# Patient Record
Sex: Male | Born: 1950 | Race: White | Hispanic: No | Marital: Married | Smoking: Never smoker
Health system: Southern US, Community
[De-identification: ages and names within clinical notes are randomized; demographics above are authoritative.]

## PROBLEM LIST (undated history)

## (undated) DIAGNOSIS — K589 Irritable bowel syndrome without diarrhea: Secondary | ICD-10-CM

## (undated) DIAGNOSIS — K219 Gastro-esophageal reflux disease without esophagitis: Secondary | ICD-10-CM

## (undated) DIAGNOSIS — M25561 Pain in right knee: Secondary | ICD-10-CM

## (undated) DIAGNOSIS — M25562 Pain in left knee: Secondary | ICD-10-CM

## (undated) DIAGNOSIS — B1920 Unspecified viral hepatitis C without hepatic coma: Secondary | ICD-10-CM

## (undated) DIAGNOSIS — C4490 Unspecified malignant neoplasm of skin, unspecified: Secondary | ICD-10-CM

## (undated) DIAGNOSIS — E079 Disorder of thyroid, unspecified: Secondary | ICD-10-CM

## (undated) DIAGNOSIS — G8929 Other chronic pain: Secondary | ICD-10-CM

## (undated) DIAGNOSIS — K635 Polyp of colon: Secondary | ICD-10-CM

## (undated) HISTORY — DX: Pain in left knee: M25.562

## (undated) HISTORY — PX: BUNIONECTOMY: SHX129

## (undated) HISTORY — DX: Irritable bowel syndrome, unspecified: K58.9

## (undated) HISTORY — DX: Other chronic pain: G89.29

## (undated) HISTORY — DX: Unspecified malignant neoplasm of skin, unspecified: C44.90

## (undated) HISTORY — DX: Disorder of thyroid, unspecified: E07.9

## (undated) HISTORY — DX: Pain in right knee: M25.561

## (undated) HISTORY — PX: LIPOMA EXCISION: SHX5283

## (undated) HISTORY — PX: TONSILLECTOMY: SUR1361

## (undated) HISTORY — DX: Polyp of colon: K63.5

## (undated) HISTORY — DX: Unspecified viral hepatitis C without hepatic coma: B19.20

## (undated) HISTORY — DX: Gastro-esophageal reflux disease without esophagitis: K21.9

---

## 2011-09-25 DIAGNOSIS — B182 Chronic viral hepatitis C: Secondary | ICD-10-CM | POA: Insufficient documentation

## 2015-06-16 HISTORY — PX: ESOPHAGOGASTRODUODENOSCOPY: SHX1529

## 2016-03-06 HISTORY — PX: TRANSURETHRAL RESECTION OF PROSTATE: SHX73

## 2016-05-02 HISTORY — PX: COLONOSCOPY: SHX174

## 2016-05-02 LAB — HM COLONOSCOPY

## 2017-01-10 LAB — LIPID PANEL
CHOLESTEROL: 180 (ref 0–200)
HDL: 37 (ref 35–70)
LDL Cholesterol: 98
Triglycerides: 226 — AB (ref 40–160)

## 2017-01-10 LAB — HEPATIC FUNCTION PANEL
ALT: 35 (ref 10–40)
AST: 18 (ref 14–40)
Bilirubin, Total: 0.5

## 2017-01-10 LAB — CBC AND DIFFERENTIAL
HCT: 48 (ref 41–53)
Hemoglobin: 15.8 (ref 13.5–17.5)
Platelets: 206 (ref 150–399)
WBC: 7.9

## 2017-01-10 LAB — BASIC METABOLIC PANEL
BUN: 16 (ref 4–21)
Creatinine: 1.3 (ref 0.6–1.3)
Glucose: 120
Potassium: 3.8 (ref 3.4–5.3)
Sodium: 142 (ref 137–147)

## 2017-01-10 LAB — TSH: TSH: 2.3 (ref 0.41–5.90)

## 2017-06-13 DIAGNOSIS — R338 Other retention of urine: Secondary | ICD-10-CM | POA: Diagnosis not present

## 2017-06-13 DIAGNOSIS — R972 Elevated prostate specific antigen [PSA]: Secondary | ICD-10-CM | POA: Diagnosis not present

## 2017-06-13 DIAGNOSIS — N401 Enlarged prostate with lower urinary tract symptoms: Secondary | ICD-10-CM | POA: Diagnosis not present

## 2017-07-10 DIAGNOSIS — B182 Chronic viral hepatitis C: Secondary | ICD-10-CM | POA: Diagnosis not present

## 2017-07-10 DIAGNOSIS — R1310 Dysphagia, unspecified: Secondary | ICD-10-CM | POA: Diagnosis not present

## 2017-07-10 DIAGNOSIS — R69 Illness, unspecified: Secondary | ICD-10-CM | POA: Diagnosis not present

## 2017-07-10 DIAGNOSIS — K635 Polyp of colon: Secondary | ICD-10-CM | POA: Diagnosis not present

## 2017-07-10 DIAGNOSIS — R972 Elevated prostate specific antigen [PSA]: Secondary | ICD-10-CM | POA: Diagnosis not present

## 2017-07-10 DIAGNOSIS — E039 Hypothyroidism, unspecified: Secondary | ICD-10-CM | POA: Diagnosis not present

## 2017-07-10 DIAGNOSIS — C4491 Basal cell carcinoma of skin, unspecified: Secondary | ICD-10-CM | POA: Diagnosis not present

## 2017-07-10 DIAGNOSIS — E785 Hyperlipidemia, unspecified: Secondary | ICD-10-CM | POA: Diagnosis not present

## 2017-07-10 DIAGNOSIS — R319 Hematuria, unspecified: Secondary | ICD-10-CM | POA: Diagnosis not present

## 2017-07-10 DIAGNOSIS — K589 Irritable bowel syndrome without diarrhea: Secondary | ICD-10-CM | POA: Diagnosis not present

## 2017-07-10 DIAGNOSIS — N400 Enlarged prostate without lower urinary tract symptoms: Secondary | ICD-10-CM | POA: Diagnosis not present

## 2017-07-10 LAB — BASIC METABOLIC PANEL
BUN: 18 (ref 4–21)
Creatinine: 1.3 (ref 0.6–1.3)
GLUCOSE: 91
Sodium: 142 (ref 137–147)

## 2017-07-10 LAB — TSH: TSH: 2 (ref 0.41–5.90)

## 2017-07-10 LAB — PSA: PSA: 1.01

## 2017-07-10 LAB — CBC AND DIFFERENTIAL
HCT: 45 (ref 41–53)
Hemoglobin: 14.7 (ref 13.5–17.5)
Platelets: 178 (ref 150–399)
WBC: 4.4

## 2017-07-10 LAB — LIPID PANEL
CHOLESTEROL: 156 (ref 0–200)
HDL: 36 (ref 35–70)
LDL Cholesterol: 82
Triglycerides: 188 — AB (ref 40–160)

## 2017-07-10 LAB — HEPATIC FUNCTION PANEL
ALT: 30 (ref 10–40)
AST: 20 (ref 14–40)

## 2017-07-10 LAB — HEMOGLOBIN A1C: Hemoglobin A1C: 14.7

## 2017-07-12 DIAGNOSIS — L570 Actinic keratosis: Secondary | ICD-10-CM | POA: Diagnosis not present

## 2017-07-12 DIAGNOSIS — L578 Other skin changes due to chronic exposure to nonionizing radiation: Secondary | ICD-10-CM | POA: Diagnosis not present

## 2017-07-12 DIAGNOSIS — L814 Other melanin hyperpigmentation: Secondary | ICD-10-CM | POA: Diagnosis not present

## 2017-07-12 DIAGNOSIS — D1801 Hemangioma of skin and subcutaneous tissue: Secondary | ICD-10-CM | POA: Diagnosis not present

## 2017-07-12 DIAGNOSIS — L218 Other seborrheic dermatitis: Secondary | ICD-10-CM | POA: Diagnosis not present

## 2017-11-20 DIAGNOSIS — Z0100 Encounter for examination of eyes and vision without abnormal findings: Secondary | ICD-10-CM | POA: Diagnosis not present

## 2018-03-18 ENCOUNTER — Encounter: Payer: Self-pay | Admitting: Family Medicine

## 2018-03-18 ENCOUNTER — Ambulatory Visit (INDEPENDENT_AMBULATORY_CARE_PROVIDER_SITE_OTHER): Payer: Medicare HMO | Admitting: Family Medicine

## 2018-03-18 ENCOUNTER — Ambulatory Visit (INDEPENDENT_AMBULATORY_CARE_PROVIDER_SITE_OTHER): Payer: Medicare HMO

## 2018-03-18 VITALS — BP 132/64 | HR 78 | Temp 98.2°F | Ht 74.0 in | Wt 198.0 lb

## 2018-03-18 DIAGNOSIS — M222X1 Patellofemoral disorders, right knee: Secondary | ICD-10-CM | POA: Insufficient documentation

## 2018-03-18 DIAGNOSIS — E039 Hypothyroidism, unspecified: Secondary | ICD-10-CM

## 2018-03-18 DIAGNOSIS — G8929 Other chronic pain: Secondary | ICD-10-CM | POA: Diagnosis not present

## 2018-03-18 DIAGNOSIS — M222X2 Patellofemoral disorders, left knee: Secondary | ICD-10-CM

## 2018-03-18 DIAGNOSIS — M25562 Pain in left knee: Secondary | ICD-10-CM | POA: Diagnosis not present

## 2018-03-18 DIAGNOSIS — M1711 Unilateral primary osteoarthritis, right knee: Secondary | ICD-10-CM | POA: Diagnosis not present

## 2018-03-18 DIAGNOSIS — M1712 Unilateral primary osteoarthritis, left knee: Secondary | ICD-10-CM | POA: Diagnosis not present

## 2018-03-18 DIAGNOSIS — M25561 Pain in right knee: Secondary | ICD-10-CM

## 2018-03-18 HISTORY — DX: Other chronic pain: G89.29

## 2018-03-18 HISTORY — DX: Pain in left knee: M25.562

## 2018-03-18 LAB — BASIC METABOLIC PANEL
BUN: 13 mg/dL (ref 6–23)
CALCIUM: 9.7 mg/dL (ref 8.4–10.5)
CO2: 28 mEq/L (ref 19–32)
Chloride: 103 mEq/L (ref 96–112)
Creatinine, Ser: 1.32 mg/dL (ref 0.40–1.50)
GFR: 57.07 mL/min — AB (ref >60.00)
Glucose, Bld: 83 mg/dL (ref 70–99)
Potassium: 4.3 mEq/L (ref 3.5–5.1)
Sodium: 139 mEq/L (ref 135–145)

## 2018-03-18 LAB — TSH: TSH: 3.59 u[IU]/mL (ref 0.35–4.50)

## 2018-03-18 MED ORDER — LEVOTHYROXINE SODIUM 75 MCG PO TABS: 75.0000 ug | ORAL_TABLET | Freq: Every day | ORAL | 2 refills | Status: DC

## 2018-03-18 NOTE — Assessment & Plan Note (Signed)
Update TSH

## 2018-03-18 NOTE — Progress Notes (Signed)
Nathan [name] - 68 y.o. male MRN [id number]  Date of birth: 1950-07-05  Subjective Chief Complaint  Patient presents with  . Knee Pain    HPI Nathan [name] is a 68 y.o. male with history of hypothyroidism, Hep C s/p tx with Harvoni, and GERD here today to establish care with complaint of bilateral knee pain.   -Knee pain:  Reports having knee pain for years, worsening over the past few months.  Pain worse with walking/standing.  Went hiking with his son and knee pain much worse, especially walking downhill.  Denies swelling of the knee.  Takes aleve occasionally with some improvement.    -Hypothyroid:  Follows [name] 6 months.  No recent changes to medication strength.  Denies symptoms of hypo/hyper-thyroidism.   ROS:  A comprehensive ROS was completed and negative except as noted per HPI  Allergies  Allergen Reactions  . Bactrim [Sulfamethoxazole-Trimethoprim] Other (See Comments)    Fever     Past Medical History:  Diagnosis Date  . Colon polyps   . GERD (gastroesophageal reflux disease)   . Hepatitis C    s/p tx with Harvoni  . Skin cancer   . Thyroid disease     Past Surgical History:  Procedure Laterality Date  . BUNIONECTOMY Right   . LIPOMA EXCISION    . TONSILLECTOMY    . TRANSURETHRAL RESECTION OF PROSTATE  2018    Social History   Socioeconomic History  . Marital status: Married    Spouse name: Not on file  . Number of children: Not on file  . Years of education: Not on file  . Highest education level: Not on file  Occupational History  . Not on file  Social Needs  . Financial resource strain: Not on file  . Food insecurity:    Worry: Not on file    Inability: Not on file  . Transportation needs:    Medical: Not on file    Non-medical: Not on file  Tobacco Use  . Smoking status: Never Smoker  . Smokeless tobacco: Never Used  Substance and Sexual Activity  . Alcohol use: Not Currently    Frequency: Never  . Drug use: Never  . Sexual activity: Yes    Lifestyle  . Physical activity:    Days per week: Not on file    Minutes per session: Not on file  . Stress: Not on file  Relationships  . Social connections:    Talks on phone: Not on file    Gets together: Not on file    Attends religious service: Not on file    Active member of club or organization: Not on file    Attends meetings of clubs or organizations: Not on file    Relationship status: Not on file  Other Topics Concern  . Not on file  Social History Narrative  . Not on file    Family History  Problem Relation Age of Onset  . Heart disease Mother   . Heart disease Father   . Hearing loss Father   . Hypertension Father   . Heart disease Sister   . Hypertension Sister   . Heart disease Brother   . Hypertension Brother   . Heart disease Maternal Grandfather   . Heart disease Paternal Grandfather     Health Maintenance  Topic Date Due  . Hepatitis C Screening  January 07, 1949  . TETANUS/TDAP  09/12/1967  . COLONOSCOPY  09/12/1998  . PNA vac Low Risk Adult (1 of 2 -  PCV13) 09/11/2013  . INFLUENZA VACCINE  10/04/2017    ----------------------------------------------------------------------------------------------------------------------------------------------------------------------------------------------------------------- Physical Exam BP 132/64   Pulse 78   Temp 98.2 F (36.8 C) (Oral)   Ht 6\' 2"  (1.88 m)   Wt 198 lb (89.8 kg)   SpO2 98%   BMI 25.42 kg/m   Physical Exam Constitutional:      Appearance: Normal appearance.  HENT:     Head: Normocephalic and atraumatic.     Mouth/Throat:     Pharynx: Oropharynx is clear.  Eyes:     General: No scleral icterus. Cardiovascular:     Rate and Rhythm: Normal rate and regular rhythm.  Pulmonary:     Effort: Pulmonary effort is normal.     Breath sounds: Normal breath sounds.  Abdominal:     General: Abdomen is flat. Bowel sounds are normal. There is no distension.     Palpations: Abdomen is soft.  There is no mass.  Musculoskeletal:     Comments: Bilateral knees normal to inspection and palpation.  No effusion noted.  ROM is normal with mild crepitus with flexion.  Negative meniscal provocation testing.   Lymphadenopathy:     Cervical: No cervical adenopathy.  Skin:    General: Skin is warm and dry.     Findings: No rash.  Neurological:     General: No focal deficit present.     Mental Status: He is alert.  Psychiatric:        Mood and Affect: Mood normal.        Behavior: Behavior normal.     ------------------------------------------------------------------------------------------------------------------------------------------------------------------------------------------------------------------- Assessment and Plan  Chronic pain of both knees -Xrays ordered -May benefit from trial of anti-inflammatory   Acquired hypothyroidism Update TSH

## 2018-03-18 NOTE — Patient Instructions (Signed)
-  Great to meet you today! -Continue current medications -We'll be in touch with lab and xray results.  -See me again in 6 months or sooner if needed.

## 2018-03-18 NOTE — Assessment & Plan Note (Signed)
-  Xrays ordered -May benefit from trial of anti-inflammatory

## 2018-03-20 NOTE — Progress Notes (Signed)
Xrays show bilateral arthritic changes.  We can try oral anti-inflammatories as needed initially or he can see Dr. Raeford Razor and try injections.

## 2018-03-20 NOTE — Progress Notes (Signed)
Labs are normal as well.

## 2018-03-26 ENCOUNTER — Encounter: Payer: Self-pay | Admitting: Family Medicine

## 2018-03-26 ENCOUNTER — Ambulatory Visit (INDEPENDENT_AMBULATORY_CARE_PROVIDER_SITE_OTHER): Payer: Medicare HMO | Admitting: Family Medicine

## 2018-03-26 VITALS — BP 102/78 | HR 98 | Temp 98.2°F | Ht 74.0 in | Wt 200.4 lb

## 2018-03-26 DIAGNOSIS — M222X1 Patellofemoral disorders, right knee: Secondary | ICD-10-CM

## 2018-03-26 DIAGNOSIS — M222X2 Patellofemoral disorders, left knee: Secondary | ICD-10-CM

## 2018-03-26 NOTE — Assessment & Plan Note (Signed)
Has some weakness with hip abduction which is likely contributing.  X-rays did not show any significant structural changes and no effusion on exam today. -Focus and counseled on home exercise therapy and supportive care. -Provided samples of Pennsaid. -If no improvement will consider physical therapy.

## 2018-03-26 NOTE — Patient Instructions (Signed)
Nice to meet you  Please try the rehab exercises  Please use ice if need be. 20 minutes at a time and 3-4 times a day  Please try Aspercreme with lidocaine. Please follow-up with me in 4 to 6 weeks if no better.

## 2018-03-26 NOTE — Progress Notes (Signed)
Nathan [name] - 68 y.o. male MRN [id number]  Date of birth: 05-Apr-1950  SUBJECTIVE:  Including CC & ROS.  Chief Complaint  Patient presents with  . Knee Pain    both / pt sts for years     Nathan [name] is a 68 y.o. male that is presenting with acute on chronic left and right knee pain.  The pain is been ongoing for years.  The pain is anterior nature.  He feels the pain worse with walking or getting up from a seated position.  No prior history of surgeries.  Has tried ibuprofen with limited improvement.  Pain is sharp and stabbing.  Denies any swelling.  No injury or inciting event.  Pain is localized to the knee..  Independent review of the left and right knee x-rays from 1/13 show moderate medial joint space narrowing of the left knee and mild medial right knee joint space narrowing   Review of Systems  Constitutional: Negative for fever.  HENT: Negative for congestion.   Respiratory: Negative for cough.   Cardiovascular: Negative for chest pain.  Gastrointestinal: Negative for abdominal pain.  Musculoskeletal: Negative for joint swelling.  Skin: Negative for color change.  Neurological: Negative for weakness.  Hematological: Negative for adenopathy.  Psychiatric/Behavioral: Negative for agitation.    HISTORY: Past Medical, Surgical, Social, and Family History Reviewed & Updated per EMR.   Pertinent Historical Findings include:  Past Medical History:  Diagnosis Date  . Chronic pain of both knees 03/18/2018  . Colon polyps   . GERD (gastroesophageal reflux disease)   . Hepatitis C    s/p tx with Harvoni  . Skin cancer   . Thyroid disease     Past Surgical History:  Procedure Laterality Date  . BUNIONECTOMY Right   . LIPOMA EXCISION    . TONSILLECTOMY    . TRANSURETHRAL RESECTION OF PROSTATE  2018    Allergies  Allergen Reactions  . Bactrim [Sulfamethoxazole-Trimethoprim] Other (See Comments)    Fever     Family History  Problem Relation Age of Onset  . Heart  disease Mother   . Heart disease Father   . Hearing loss Father   . Hypertension Father   . Heart disease Sister   . Hypertension Sister   . Heart disease Brother   . Hypertension Brother   . Heart disease Maternal Grandfather   . Heart disease Paternal Grandfather      Social History   Socioeconomic History  . Marital status: Married    Spouse name: Not on file  . Number of children: Not on file  . Years of education: Not on file  . Highest education level: Not on file  Occupational History  . Not on file  Social Needs  . Financial resource strain: Not on file  . Food insecurity:    Worry: Not on file    Inability: Not on file  . Transportation needs:    Medical: Not on file    Non-medical: Not on file  Tobacco Use  . Smoking status: Never Smoker  . Smokeless tobacco: Never Used  Substance and Sexual Activity  . Alcohol use: Not Currently    Frequency: Never  . Drug use: Never  . Sexual activity: Yes  Lifestyle  . Physical activity:    Days per week: Not on file    Minutes per session: Not on file  . Stress: Not on file  Relationships  . Social connections:    Talks on phone: Not on  file    Gets together: Not on file    Attends religious service: Not on file    Active member of club or organization: Not on file    Attends meetings of clubs or organizations: Not on file    Relationship status: Not on file  . Intimate partner violence:    Fear of current or ex partner: Not on file    Emotionally abused: Not on file    Physically abused: Not on file    Forced sexual activity: Not on file  Other Topics Concern  . Not on file  Social History Narrative  . Not on file     PHYSICAL EXAM:  VS: BP 102/78   Pulse 98   Temp 98.2 F (36.8 C) (Oral)   Ht 6\' 2"  (1.88 m)   Wt 200 lb 6.4 oz (90.9 kg)   SpO2 96%   BMI 25.73 kg/m  Physical Exam Gen: NAD, alert, cooperative with exam, well-appearing ENT: normal lips, normal nasal mucosa,  Eye: normal EOM,  normal conjunctiva and lids CV:  no edema, +2 pedal pulses   Resp: no accessory muscle use, non-labored,  Skin: no rashes, no areas of induration  Neuro: normal tone, normal sensation to touch Psych:  normal insight, alert and oriented MSK:  Left and right knee: No effusion. No tenderness to palpation over the medial lateral joint line. Normal range of motion. Normal strength to resistance No instability with valgus or varus stress testing. Negative McMurray's test. No pain with patellar grind or compression. Neurovascular intact.     ASSESSMENT & PLAN:   Patellofemoral syndrome of both knees Has some weakness with hip abduction which is likely contributing.  X-rays did not show any significant structural changes and no effusion on exam today. -Focus and counseled on home exercise therapy and supportive care. -Provided samples of Pennsaid. -If no improvement will consider physical therapy.

## 2018-03-28 ENCOUNTER — Encounter: Payer: Self-pay | Admitting: Family Medicine

## 2018-04-10 DIAGNOSIS — L280 Lichen simplex chronicus: Secondary | ICD-10-CM | POA: Diagnosis not present

## 2018-04-10 DIAGNOSIS — L570 Actinic keratosis: Secondary | ICD-10-CM | POA: Diagnosis not present

## 2018-04-10 DIAGNOSIS — D485 Neoplasm of uncertain behavior of skin: Secondary | ICD-10-CM | POA: Diagnosis not present

## 2018-04-10 DIAGNOSIS — L821 Other seborrheic keratosis: Secondary | ICD-10-CM | POA: Diagnosis not present

## 2018-04-10 DIAGNOSIS — D229 Melanocytic nevi, unspecified: Secondary | ICD-10-CM | POA: Diagnosis not present

## 2018-04-23 DIAGNOSIS — Z0100 Encounter for examination of eyes and vision without abnormal findings: Secondary | ICD-10-CM | POA: Diagnosis not present

## 2018-04-23 DIAGNOSIS — H524 Presbyopia: Secondary | ICD-10-CM | POA: Diagnosis not present

## 2018-09-13 ENCOUNTER — Ambulatory Visit (INDEPENDENT_AMBULATORY_CARE_PROVIDER_SITE_OTHER): Payer: Medicare HMO | Admitting: Family Medicine

## 2018-09-13 ENCOUNTER — Telehealth: Payer: Self-pay | Admitting: General Practice

## 2018-09-13 ENCOUNTER — Encounter: Payer: Self-pay | Admitting: Family Medicine

## 2018-09-13 ENCOUNTER — Telehealth: Payer: Self-pay | Admitting: Family Medicine

## 2018-09-13 DIAGNOSIS — Z20828 Contact with and (suspected) exposure to other viral communicable diseases: Secondary | ICD-10-CM

## 2018-09-13 DIAGNOSIS — Z20822 Contact with and (suspected) exposure to covid-19: Secondary | ICD-10-CM

## 2018-09-13 NOTE — Telephone Encounter (Signed)
Called [facility] Pharmacist @ [facility], he explained that it was the same , however the generic Levothyroxine is no longer available .

## 2018-09-13 NOTE — Addendum Note (Signed)
Addended by: Denman George on: 09/13/2018 09:43 AM   Modules accepted: Orders

## 2018-09-13 NOTE — Telephone Encounter (Signed)
Pt has been scheduled for Covid testing.  Pt was referred by: Luetta Nutting, DO Scheduled apt with pt directly.

## 2018-09-13 NOTE — Assessment & Plan Note (Signed)
-  Recommend testing for COVID given unprotected close contact with known positive individual.   -Discussed isolating until results return.  -He will let us know if he develops symptoms.

## 2018-09-13 NOTE — Telephone Encounter (Signed)
Sheran Spine, Pharmacist with [facility], requesting call back from CMA to discuss switching patient from levothyroxine (SYNTHROID, LEVOTHROID) 75 MCG tablet to uthyrox. Sheran Spine did not specify why change was requested. Please advise.

## 2018-09-13 NOTE — Progress Notes (Signed)
Nathan [name] - 68 y.o. male MRN [id number]  Date of birth: [birth year]   This visit type was conducted due to national recommendations for restrictions regarding the COVID-19 Pandemic (e.g. social distancing).  This format is felt to be most appropriate for this patient at this time.  All issues noted in this document were discussed and addressed.  No physical exam was performed (except for noted visual exam findings with Video Visits).  I discussed the limitations of evaluation and management by telemedicine and the availability of in person appointments. The patient expressed understanding and agreed to proceed.  I connected with@ on 09/13/18 at  8:30 AM EDT by a video enabled telemedicine application and verified that I am speaking with the correct person using two identifiers.   Patient Location: Home [address] [place] [address]   Provider location:   Home office  Chief Complaint  Patient presents with  . Possible expsoure COVID    on job site, 4 other workers exposed by 5 ppl within a 7-10 days , No Sx/Sy, self quarantine.     HPI  Nathan [name] is a 68 y.o. male who presents via audio/video conferencing for a telehealth visit today.  He reports exposure to COVID-19.  He states that he was visiting a job site and had contact with multiple employees that tested positive for [place].  Also spent time riding in a truck with an employee that tested positive for [place].  He was not wearing a mask at the time.  This all occurred over the pat 7-10 days.   He denies symptoms at this time including fever, chills, rash, nausea, vomiting, diarrhea, chest pain, shortness of breath, headache, body aches or fatigue.     ROS:  A comprehensive ROS was completed and negative except as noted per HPI  Past Medical History:  Diagnosis Date  . Chronic pain of both knees 03/18/2018  . Colon polyps   . GERD (gastroesophageal reflux disease)   . Hepatitis C    s/p tx with Harvoni  . Skin cancer   .  Thyroid disease     Past Surgical History:  Procedure Laterality Date  . BUNIONECTOMY Right   . COLONOSCOPY  05/02/2016   [facility]  . ESOPHAGOGASTRODUODENOSCOPY  06/16/2015   [facility]  . LIPOMA EXCISION    . TONSILLECTOMY    . TRANSURETHRAL RESECTION OF PROSTATE  2018    Family History  Problem Relation Age of Onset  . Heart disease Mother   . Heart disease Father   . Hearing loss Father   . Hypertension Father   . Heart disease Sister   . Hypertension Sister   . Heart disease Brother   . Hypertension Brother   . Heart disease Maternal Grandfather   . Heart disease Paternal Grandfather     Social History   Socioeconomic History  . Marital status: Married    Spouse name: Not on file  . Number of children: Not on file  . Years of education: Not on file  . Highest education level: Not on file  Occupational History  . Not on file  Social Needs  . Financial resource strain: Not on file  . Food insecurity    Worry: Not on file    Inability: Not on file  . Transportation needs    Medical: Not on file    Non-medical: Not on file  Tobacco Use  . Smoking status: Never Smoker  . Smokeless tobacco:  Never Used  Substance and Sexual Activity  . Alcohol use: Not Currently    Frequency: Never  . Drug use: Never  . Sexual activity: Yes  Lifestyle  . Physical activity    Days per week: Not on file    Minutes per session: Not on file  . Stress: Not on file  Relationships  . Social Herbalist on phone: Not on file    Gets together: Not on file    Attends religious service: Not on file    Active member of club or organization: Not on file    Attends meetings of clubs or organizations: Not on file    Relationship status: Not on file  . Intimate partner violence    Fear of current or ex partner: Not on file    Emotionally abused: Not on file    Physically abused: Not on file    Forced sexual activity: Not on file   Other Topics Concern  . Not on file  Social History Narrative  . Not on file     Current Outpatient Medications:  .  levothyroxine (SYNTHROID, LEVOTHROID) 75 MCG tablet, Take 1 tablet (75 mcg total) by mouth daily before breakfast., Disp: 90 tablet, Rfl: 2 .  Omega-3 Fatty Acids (FISH OIL) 1000 MG CAPS, Take by mouth daily., Disp: , Rfl:   EXAM:  VITALS per patient if applicable: BP 213/08 Comment: bp cuff @ HM  Pulse 86   Ht 6\' 2"  (1.88 m)   Wt 198 lb (89.8 kg)   BMI 25.42 kg/m   GENERAL: alert, oriented, appears well and in no acute distress  HEENT: atraumatic, conjunttiva clear, no obvious abnormalities on inspection of external nose and ears  NECK: normal movements of the head and neck  LUNGS: on inspection no signs of respiratory distress, breathing rate appears normal, no obvious gross SOB, gasping or wheezing  CV: no obvious cyanosis  MS: moves all visible extremities without noticeable abnormality  PSYCH/NEURO: pleasant and cooperative, no obvious depression or anxiety, speech and thought processing grossly intact  ASSESSMENT AND PLAN:  Discussed the following assessment and plan:  Exposure to Covid-19 Virus -Recommend testing for COVID given unprotected close contact with known positive individual.   -Discussed isolating until results return.  -He will let us know if he develops symptoms.        I discussed the assessment and treatment plan with the patient. The patient was provided an opportunity to ask questions and all were answered. The patient agreed with the plan and demonstrated an understanding of the instructions.   The patient was advised to call back or seek an in-person evaluation if the symptoms worsen or if the condition fails to improve as anticipated.    Luetta Nutting, DO

## 2018-09-16 ENCOUNTER — Ambulatory Visit: Payer: Medicare HMO | Admitting: Family Medicine

## 2018-09-16 ENCOUNTER — Other Ambulatory Visit: Payer: Medicare HMO

## 2018-09-16 DIAGNOSIS — Z20822 Contact with and (suspected) exposure to covid-19: Secondary | ICD-10-CM

## 2018-09-16 DIAGNOSIS — R6889 Other general symptoms and signs: Secondary | ICD-10-CM | POA: Diagnosis not present

## 2018-09-20 LAB — NOVEL CORONAVIRUS, NAA: SARS-CoV-2, NAA: NOT DETECTED

## 2018-09-26 ENCOUNTER — Other Ambulatory Visit (INDEPENDENT_AMBULATORY_CARE_PROVIDER_SITE_OTHER): Payer: Medicare HMO

## 2018-09-26 ENCOUNTER — Other Ambulatory Visit: Payer: Self-pay

## 2018-09-26 DIAGNOSIS — N400 Enlarged prostate without lower urinary tract symptoms: Secondary | ICD-10-CM

## 2018-09-26 DIAGNOSIS — R748 Abnormal levels of other serum enzymes: Secondary | ICD-10-CM

## 2018-09-26 DIAGNOSIS — R739 Hyperglycemia, unspecified: Secondary | ICD-10-CM | POA: Diagnosis not present

## 2018-09-26 DIAGNOSIS — E039 Hypothyroidism, unspecified: Secondary | ICD-10-CM | POA: Diagnosis not present

## 2018-09-26 DIAGNOSIS — E782 Mixed hyperlipidemia: Secondary | ICD-10-CM

## 2018-09-26 LAB — BASIC METABOLIC PANEL
BUN: 14 mg/dL (ref 6–23)
CO2: 29 mEq/L (ref 19–32)
Calcium: 9.9 mg/dL (ref 8.4–10.5)
Chloride: 105 mEq/L (ref 96–112)
Creatinine, Ser: 1.24 mg/dL (ref 0.40–1.50)
GFR: 57.63 mL/min — ABNORMAL LOW (ref >60.00)
Glucose, Bld: 79 mg/dL (ref 70–99)
Potassium: 4.3 mEq/L (ref 3.5–5.1)
Sodium: 141 mEq/L (ref 135–145)

## 2018-09-26 LAB — LIPID PANEL
Cholesterol: 142 mg/dL (ref 0–200)
HDL: 34.7 mg/dL — ABNORMAL LOW (ref >39.00)
NonHDL: 107.17
Total CHOL/HDL Ratio: 4
Triglycerides: 211 mg/dL — ABNORMAL HIGH (ref 0.0–149.0)
VLDL: 42.2 mg/dL — ABNORMAL HIGH (ref 0.0–40.0)

## 2018-09-26 LAB — CBC WITH DIFFERENTIAL/PLATELET
Basophils Absolute: 0 10*3/uL (ref 0.0–0.1)
Basophils Relative: 1 % (ref 0.0–3.0)
Eosinophils Absolute: 0.3 10*3/uL (ref 0.0–0.7)
Eosinophils Relative: 6.2 % — ABNORMAL HIGH (ref 0.0–5.0)
HCT: 46 % (ref 39.0–52.0)
Hemoglobin: 15.7 g/dL (ref 13.0–17.0)
Lymphocytes Relative: 30.1 % (ref 12.0–46.0)
Lymphs Abs: 1.3 10*3/uL (ref 0.7–4.0)
MCHC: 34.1 g/dL (ref 30.0–36.0)
MCV: 90 fl (ref 78.0–100.0)
Monocytes Absolute: 0.4 10*3/uL (ref 0.1–1.0)
Monocytes Relative: 8.5 % (ref 3.0–12.0)
Neutro Abs: 2.3 10*3/uL (ref 1.4–7.7)
Neutrophils Relative %: 54.2 % (ref 43.0–77.0)
Platelets: 176 10*3/uL (ref 150.0–400.0)
RBC: 5.12 Mil/uL (ref 4.22–5.81)
RDW: 13 % (ref 11.5–15.5)
WBC: 4.2 10*3/uL (ref 4.0–10.5)

## 2018-09-26 LAB — HEPATIC FUNCTION PANEL
ALT: 28 U/L (ref 0–53)
AST: 26 U/L (ref 0–37)
Albumin: 4.7 g/dL (ref 3.5–5.2)
Alkaline Phosphatase: 49 U/L (ref 39–117)
Bilirubin, Direct: 0.1 mg/dL (ref 0.0–0.3)
Total Bilirubin: 0.7 mg/dL (ref 0.2–1.2)
Total Protein: 6.6 g/dL (ref 6.0–8.3)

## 2018-09-26 LAB — HEMOGLOBIN A1C: Hgb A1c MFr Bld: 5 % (ref 4.6–6.5)

## 2018-09-26 LAB — LDL CHOLESTEROL, DIRECT: Direct LDL: 80 mg/dL

## 2018-09-26 LAB — PSA: PSA: 1.08 ng/mL (ref 0.10–4.00)

## 2018-09-27 LAB — THYROID PANEL WITH TSH
Free Thyroxine Index: 2.6 (ref 1.4–3.8)
T3 Uptake: 35 % (ref 22–35)
T4, Total: 7.5 ug/dL (ref 4.9–10.5)
TSH: 1.55 mIU/L (ref 0.40–4.50)

## 2018-10-01 ENCOUNTER — Telehealth: Payer: Self-pay

## 2018-10-01 NOTE — Telephone Encounter (Signed)
Questions for Screening COVID-19  Symptom onset: None, Prescreened, sent MyChart message for phone number arrival [phone].  Travel or Contacts: none During this illness, did/does the patient experience any of the following symptoms? Fever >100.40F []   Yes [x]   No []   Unknown Subjective fever (felt feverish) []   Yes [x]   No []   Unknown Chills []   Yes [x]   No []   Unknown Muscle aches (myalgia) []   Yes [x]   No []   Unknown Runny nose (rhinorrhea) []   Yes [x]   No []   Unknown Sore throat []   Yes [x]   No []   Unknown Cough (new onset or worsening of chronic cough) []   Yes [x]   No []   Unknown Shortness of breath (dyspnea) []   Yes [x]   No []   Unknown Nausea or vomiting []   Yes [x]   No []   Unknown Headache []   Yes []   No [x]   Unknown Abdominal pain  []   Yes [x]   No []   Unknown Diarrhea (?3 loose/looser than normal stools/24hr period) []   Yes [x]   No []   Unknown Other, specify:  Patient risk factors: Smoker? []   Current []   Former [x]   Never If male, currently pregnant? []   Yes [x]   No  Patient Active Problem List   Diagnosis Date Noted  . Exposure to Covid-19 Virus 09/13/2018  . Acquired hypothyroidism 03/18/2018  . Patellofemoral syndrome of both knees 03/18/2018    Plan:  []   High risk for COVID-19 with red flags go to ED (with CP, SOB, weak/lightheaded, or fever > 101.5). Call ahead.  []   High risk for COVID-19 but stable. Inform provider and coordinate time for [facility] visit.   [x]   No red flags but URI signs or symptoms okay for [facility] visit.

## 2018-10-02 ENCOUNTER — Ambulatory Visit (INDEPENDENT_AMBULATORY_CARE_PROVIDER_SITE_OTHER): Payer: Medicare HMO | Admitting: Family Medicine

## 2018-10-02 ENCOUNTER — Encounter: Payer: Self-pay | Admitting: Family Medicine

## 2018-10-02 ENCOUNTER — Other Ambulatory Visit: Payer: Self-pay

## 2018-10-02 VITALS — BP 124/86 | HR 64 | Temp 97.5°F | Resp 18 | Ht 74.5 in | Wt 194.6 lb

## 2018-10-02 DIAGNOSIS — R209 Unspecified disturbances of skin sensation: Secondary | ICD-10-CM | POA: Insufficient documentation

## 2018-10-02 DIAGNOSIS — Z0000 Encounter for general adult medical examination without abnormal findings: Secondary | ICD-10-CM | POA: Insufficient documentation

## 2018-10-02 DIAGNOSIS — E039 Hypothyroidism, unspecified: Secondary | ICD-10-CM

## 2018-10-02 LAB — VITAMIN B12: Vitamin B-12: 305 pg/mL (ref 211–911)

## 2018-10-02 NOTE — Assessment & Plan Note (Signed)
TSH WNL, continue current dose of levothyroxine.

## 2018-10-02 NOTE — Patient Instructions (Signed)
Great to see you! We'll be in touch with additional lab results.

## 2018-10-02 NOTE — Assessment & Plan Note (Signed)
Well adult Recent labs reviewed with patient.  Discussed elevated TG, he will continue fish oil supplement.  Discussed low fat diet.  Immunizations:   Unsure if he has had PNA vaccines Screenings: UTD Anticipatory guidance/Risk Factor Reduction:  Recommend a diet rich in fruits in vegetables with regular exercise.

## 2018-10-02 NOTE — Progress Notes (Signed)
Nathan [name] - 68 y.o. male MRN [id number]  Date of birth: 1950-09-18  Subjective Chief Complaint  Patient presents with  . Annual Exam    CPE, labs review, numbness in feet    HPI Nathan [name] is a 68 y.o. male here today for CPE and with complaint of numbness in both feet.  He reports that this started a couple years ago after having bunionectomy.  Reports father with history of idiopathic neuropathy so he wanted to be checked to be sure this wasn't an issue for him.  Describes only as mild numbness.  Denies pain or difficulty with ambulation.  Recent labs with normal blood sugar.  TSH within normal limits.  He does have history of Hep C, treated with Harvoni.  He denies any joint pain or numbness/tingling elsewhere.    He is a non-smoker  He is UTD on appropriate cancer screenings  He is unsure if he has had pneumonia vaccines.   He stays pretty active and follows a healthy diet.   Review of Systems  Constitutional: Negative for chills, fever, malaise/fatigue and weight loss.  HENT: Negative for congestion, ear pain and sore throat.   Eyes: Negative for blurred vision, double vision and pain.  Respiratory: Negative for cough and shortness of breath.   Cardiovascular: Negative for chest pain and palpitations.  Gastrointestinal: Negative for abdominal pain, blood in stool, constipation, heartburn and nausea.  Genitourinary: Negative for dysuria and urgency.  Musculoskeletal: Negative for joint pain and myalgias.  Neurological: Positive for tingling. Negative for dizziness and headaches.  Endo/Heme/Allergies: Does not bruise/bleed easily.  Psychiatric/Behavioral: Negative for depression. The patient is not nervous/anxious and does not have insomnia.     Allergies  Allergen Reactions  . Bactrim [Sulfamethoxazole-Trimethoprim] Other (See Comments)    Fever     Past Medical History:  Diagnosis Date  . Chronic pain of both knees 03/18/2018  . Colon polyps   . GERD (gastroesophageal  reflux disease)   . Hepatitis C    s/p tx with Harvoni  . Skin cancer   . Thyroid disease     Past Surgical History:  Procedure Laterality Date  . BUNIONECTOMY Right   . COLONOSCOPY  05/02/2016   [facility]  . ESOPHAGOGASTRODUODENOSCOPY  06/16/2015   [facility]  . LIPOMA EXCISION    . TONSILLECTOMY    . TRANSURETHRAL RESECTION OF PROSTATE  2018    Social History   Socioeconomic History  . Marital status: Married    Spouse name: Not on file  . Number of children: Not on file  . Years of education: Not on file  . Highest education level: Not on file  Occupational History  . Not on file  Social Needs  . Financial resource strain: Not on file  . Food insecurity    Worry: Not on file    Inability: Not on file  . Transportation needs    Medical: Not on file    Non-medical: Not on file  Tobacco Use  . Smoking status: Never Smoker  . Smokeless tobacco: Never Used  Substance and Sexual Activity  . Alcohol use: Not Currently    Frequency: Never  . Drug use: Never  . Sexual activity: Yes  Lifestyle  . Physical activity    Days per week: Not on file    Minutes per session: Not on file  . Stress: Not on file  Relationships  . Social connections    Talks on phone: Not on  file    Gets together: Not on file    Attends religious service: Not on file    Active member of club or organization: Not on file    Attends meetings of clubs or organizations: Not on file    Relationship status: Not on file  Other Topics Concern  . Not on file  Social History Narrative  . Not on file    Family History  Problem Relation Age of Onset  . Heart disease Mother   . Heart disease Father   . Hearing loss Father   . Hypertension Father   . Heart disease Sister   . Hypertension Sister   . Heart disease Brother   . Hypertension Brother   . Heart disease Maternal Grandfather   . Heart disease Paternal Grandfather     Health Maintenance  Topic  Date Due  . Samul Dada  09/12/1967  . PNA vac Low Risk Adult (1 of 2 - PCV13) 09/11/2013  . INFLUENZA VACCINE  10/05/2018  . COLONOSCOPY  05/02/2026  . Hepatitis C Screening  Completed    ----------------------------------------------------------------------------------------------------------------------------------------------------------------------------------------------------------------- Physical Exam BP 124/86   Pulse 64   Temp (!) 97.5 F (36.4 C) (Oral)   Resp 18   Ht 6' 2.5" (1.892 m)   Wt 194 lb 9.6 oz (88.3 kg)   SpO2 96%   BMI 24.65 kg/m   Physical Exam Constitutional:      General: He is not in acute distress. HENT:     Head: Normocephalic and atraumatic.     Right Ear: External ear normal.     Left Ear: External ear normal.  Eyes:     General: No scleral icterus. Neck:     Musculoskeletal: Normal range of motion.     Thyroid: No thyromegaly.  Cardiovascular:     Rate and Rhythm: Normal rate and regular rhythm.     Pulses:          Dorsalis pedis pulses are 2+ on the right side and 2+ on the left side.       Posterior tibial pulses are 2+ on the right side and 2+ on the left side.     Heart sounds: Normal heart sounds.  Pulmonary:     Effort: Pulmonary effort is normal.     Breath sounds: Normal breath sounds.  Abdominal:     General: Bowel sounds are normal. There is no distension.     Palpations: Abdomen is soft.     Tenderness: There is no abdominal tenderness. There is no guarding.  Musculoskeletal:     Right foot: Normal range of motion. No deformity.     Left foot: Normal range of motion. No deformity.  Feet:     Right foot:     Protective Sensation: 4 sites tested. 2 sites sensed.     Skin integrity: No ulcer, blister, skin breakdown, erythema, warmth, callus or dry skin.     Left foot:     Protective Sensation: 4 sites tested. 2 sites sensed.     Skin integrity: No ulcer, blister, skin breakdown, erythema, warmth, callus or dry skin.   Lymphadenopathy:     Cervical: No cervical adenopathy.  Skin:    General: Skin is warm and dry.     Findings: No rash.  Neurological:     Mental Status: He is alert and oriented to person, place, and time.     Cranial Nerves: No cranial nerve deficit.     Motor: No abnormal muscle tone.  Psychiatric:        Behavior: Behavior normal.     ------------------------------------------------------------------------------------------------------------------------------------------------------------------------------------------------------------------- Assessment and Plan  Acquired hypothyroidism TSH WNL, continue current dose of levothyroxine.   Disturbance of skin sensation -May be due to nerve compression related to prior bunion surgery.  Symptoms stable.  Recommend checking B12 as well.  Discussed no way to be completely sure that this isn't idiopathic/hereditary neuropathy but if worsening we can refer to neurology.     Well adult exam Well adult Recent labs reviewed with patient.  Discussed elevated TG, he will continue fish oil supplement.  Discussed low fat diet.  Immunizations:   Unsure if he has had PNA vaccines Screenings: UTD Anticipatory guidance/Risk Factor Reduction:  Recommend a diet rich in fruits in vegetables with regular exercise.

## 2018-10-02 NOTE — Assessment & Plan Note (Signed)
-  May be due to nerve compression related to prior bunion surgery.  Symptoms stable.  Recommend checking B12 as well.  Discussed no way to be completely sure that this isn't idiopathic/hereditary neuropathy but if worsening we can refer to neurology.

## 2018-10-04 NOTE — Progress Notes (Signed)
B12 levels are on the low end of normal.  I would recommend a b12 supplement, 500-1019mcg daily.  Thanks!

## 2018-12-10 ENCOUNTER — Other Ambulatory Visit: Payer: Self-pay | Admitting: Family Medicine

## 2019-03-07 HISTORY — PX: UPPER GASTROINTESTINAL ENDOSCOPY: SHX188

## 2019-03-09 ENCOUNTER — Other Ambulatory Visit: Payer: Self-pay | Admitting: Family Medicine

## 2019-04-30 DIAGNOSIS — H524 Presbyopia: Secondary | ICD-10-CM | POA: Diagnosis not present

## 2019-04-30 DIAGNOSIS — Z0100 Encounter for examination of eyes and vision without abnormal findings: Secondary | ICD-10-CM | POA: Diagnosis not present

## 2019-06-04 ENCOUNTER — Telehealth: Payer: Self-pay | Admitting: *Deleted

## 2019-06-04 ENCOUNTER — Ambulatory Visit: Payer: Medicare HMO | Attending: Internal Medicine

## 2019-06-04 DIAGNOSIS — Z20822 Contact with and (suspected) exposure to covid-19: Secondary | ICD-10-CM

## 2019-06-04 NOTE — Telephone Encounter (Signed)
Provided information for Covid 19 testing.

## 2019-06-05 LAB — NOVEL CORONAVIRUS, NAA: SARS-CoV-2, NAA: NOT DETECTED

## 2019-06-06 ENCOUNTER — Other Ambulatory Visit: Payer: Self-pay | Admitting: Family Medicine

## 2019-06-12 ENCOUNTER — Other Ambulatory Visit: Payer: Self-pay | Admitting: Family Medicine

## 2019-07-28 ENCOUNTER — Telehealth (INDEPENDENT_AMBULATORY_CARE_PROVIDER_SITE_OTHER): Payer: Medicare HMO | Admitting: Family

## 2019-07-28 DIAGNOSIS — J028 Acute pharyngitis due to other specified organisms: Secondary | ICD-10-CM | POA: Diagnosis not present

## 2019-07-28 MED ORDER — PREDNISONE 10 MG (21) PO TBPK: ORAL_TABLET | ORAL | 0 refills | Status: DC

## 2019-07-28 NOTE — Patient Instructions (Signed)
Pharyngitis  Pharyngitis is a sore throat (pharynx). This is when there is redness, pain, and swelling in your throat. Most of the time, this condition gets better on its own. In some cases, you may need medicine. Follow these instructions at home:  Take over-the-counter and prescription medicines only as told by your doctor. ? If you were prescribed an antibiotic medicine, take it as told by your doctor. Do not stop taking the antibiotic even if you start to feel better. ? Do not give children aspirin. Aspirin has been linked to Reye syndrome.  Drink enough water and fluids to keep your pee (urine) clear or pale yellow.  Get a lot of rest.  Rinse your mouth (gargle) with a salt-water mixture 3-4 times a day or as needed. To make a salt-water mixture, completely dissolve -1 tsp of salt in 1 cup of warm water.  If your doctor approves, you may use throat lozenges or sprays to soothe your throat. Contact a doctor if:  You have large, tender lumps in your neck.  You have a rash.  You cough up green, yellow-brown, or bloody spit. Get help right away if:  You have a stiff neck.  You drool or cannot swallow liquids.  You cannot drink or take medicines without throwing up.  You have very bad pain that does not go away with medicine.  You have problems breathing, and it is not from a stuffy nose.  You have new pain and swelling in your knees, ankles, wrists, or elbows. Summary  Pharyngitis is a sore throat (pharynx). This is when there is redness, pain, and swelling in your throat.  If you were prescribed an antibiotic medicine, take it as told by your doctor. Do not stop taking the antibiotic even if you start to feel better.  Most of the time, pharyngitis gets better on its own. Sometimes, you may need medicine. This information is not intended to replace advice given to you by your health care provider. Make sure you discuss any questions you have with your health care  provider. Document Revised: 02/02/2017 Document Reviewed: 03/28/2016 Elsevier Patient Education  2020 Elsevier Inc.  

## 2019-07-28 NOTE — Progress Notes (Signed)
Acute Office Visit  Subjective:    Patient ID: Nathan [name], male    DOB: 1950/07/20, 69 y.o.   MRN: [id number]  Chief Complaint  Patient presents with  . Sore Throat    pt. reports throat irritation x 1 month; symptoms worsen after speaking for long periods of time; temporarily relieved by hard candy or throat lozenges      Virtual Visit via Video   I connected with patient on 07/28/19 at  1:20 PM EDT by a video enabled telemedicine application and verified that I am speaking with the correct person using two identifiers.  Location patient: Home Location provider: [facility], Office Persons participating in the virtual visit: Patient, Nathan [name]  I discussed the limitations of evaluation and management by telemedicine and the availability of in person appointments. The patient expressed understanding and agreed to proceed.  Subjective:   HPI:   Patient is in today with c/o sore throat and painful swallowing when he talks for an extended period of time. Has been using cough drops that help but do not rid the symptoms. Denies any heartburn or indigestion. No sneezing, PND, or cough. Reports an endoscopy about 3 years ago where he had his esophagus stretched.  ROS:   See pertinent positives and negatives per HPI.  Patient Active Problem List   Diagnosis Date Noted  . Disturbance of skin sensation 10/02/2018  . Well adult exam 10/02/2018  . Acquired hypothyroidism 03/18/2018  . 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 Hepatitis C, chronic ([place]) 09/25/2011    Social History   Tobacco Use  . Smoking status: Never Smoker  . Smokeless tobacco: Never Used  Substance Use Topics  . Alcohol use: Not Currently    Current Outpatient Medications:  .  levothyroxine (EUTHYROX) 75 MCG tablet, Take 1qam (Plz sched with new provider for future fills), Disp: 90 tablet, Rfl: 0 .  Omega-3 Fatty Acids (FISH OIL) 1000 MG CAPS, Take by mouth daily., Disp: , Rfl:  .   predniSONE (STERAPRED UNI-PAK 21 TAB) 10 MG (21) TBPK tablet, As directed, Disp: 21 tablet, Rfl: 0  Allergies  Allergen Reactions  . Bactrim [Sulfamethoxazole-Trimethoprim] Other (See Comments)    Fever     Objective:   There were no vitals taken for this visit.  Patient is well-developed, well-nourished in no acute distress.  Resting comfortably at home.  Head is normocephalic, atraumatic.  No labored breathing.  Speech is clear and coherent with logical content.  Patient is alert and oriented at baseline.   Assessment and Plan:        Pharyngitis   Kennyth Arnold, FNP 07/28/2019  Time spent with the patient: 25minutes, of which >50% was spent in obtaining information about symptoms, reviewing previous labs, evaluations, and treatments, counseling about condition (please see the discussed topics above), and developing a plan to further investigate it; had a number of questions which I addressed.  HPI Patient is in today with c/o sore throat and painful swallowing when he talks for an extended period of time. Has been using cough drops that help but do not rid the symptoms. Denies any heartburn      Assessment & Plan:   Problem List Items Addressed This Visit    None    Visit Diagnoses    Pharyngitis due to other organism    -  Primary       Meds ordered this encounter  Medications  . predniSONE (STERAPRED UNI-PAK 21 TAB) 10 MG (  21) TBPK tablet    Sig: As directed    Dispense:  21 tablet    Refill:  0   If symptoms persist, refer to GI to see if he is experiencing silent reflux. For now, we will treat the inflammation   Kennyth Arnold, FNP

## 2019-09-03 ENCOUNTER — Other Ambulatory Visit: Payer: Self-pay | Admitting: Family Medicine

## 2019-09-18 ENCOUNTER — Other Ambulatory Visit: Payer: Self-pay

## 2019-09-19 ENCOUNTER — Ambulatory Visit (INDEPENDENT_AMBULATORY_CARE_PROVIDER_SITE_OTHER): Payer: Medicare HMO | Admitting: Family Medicine

## 2019-09-19 ENCOUNTER — Encounter: Payer: Self-pay | Admitting: Family Medicine

## 2019-09-19 VITALS — BP 110/70 | HR 81 | Temp 97.3°F | Ht 74.5 in | Wt 191.4 lb

## 2019-09-19 DIAGNOSIS — E039 Hypothyroidism, unspecified: Secondary | ICD-10-CM | POA: Diagnosis not present

## 2019-09-19 DIAGNOSIS — R7301 Impaired fasting glucose: Secondary | ICD-10-CM

## 2019-09-19 DIAGNOSIS — R1310 Dysphagia, unspecified: Secondary | ICD-10-CM | POA: Diagnosis not present

## 2019-09-19 DIAGNOSIS — Z0000 Encounter for general adult medical examination without abnormal findings: Secondary | ICD-10-CM | POA: Diagnosis not present

## 2019-09-19 DIAGNOSIS — Z125 Encounter for screening for malignant neoplasm of prostate: Secondary | ICD-10-CM | POA: Diagnosis not present

## 2019-09-19 MED ORDER — LEVOTHYROXINE SODIUM 75 MCG PO TABS: ORAL_TABLET | ORAL | 3 refills | Status: DC

## 2019-09-19 NOTE — Progress Notes (Signed)
Nathan [name] is a 69 y.o. male  Chief Complaint  Patient presents with  . Establish Care    Pt here for TOC visit.  Medication refill.    HPI:  Hill Mackie is a 69 y.o. male here for [facility] appt, previous PCP Dr. Zigmund Daniel, and annual exam, labs (not fasting but wants to do labs today) and medication refill of his levothyroxine 76mcg daily. Pt has been on thyroid med x 20 years, stable dose.   Pt states he has his pneumonia vaccine with PCP prior to Dr. Zigmund Daniel. He has nt had shingrix vaccine and is not interested at this time. He has not had the covid vaccine and wonders what my thoughts are on whether or not to get it.  He sees derm Dr. Denna Haggard every 6-12 mo and has appt in 11/2019.  Last Colonoscopy: 04/2016 [facility] - due in 04/2026  Med refills needed today: as above  He complains of 2-3 episodes in the past 1-2 mo of food getting stuck. This happens mainly when he is eating too fast and not chewing well. He had EGD w/ dilation in 2017.   Past Medical History:  Diagnosis Date  . Chronic pain of both knees 03/18/2018  . Colon polyps   . GERD (gastroesophageal reflux disease)   . Hepatitis C    s/p tx with Harvoni  . Skin cancer   . Thyroid disease     Past Surgical History:  Procedure Laterality Date  . BUNIONECTOMY Right   . COLONOSCOPY  05/02/2016   [facility]  . ESOPHAGOGASTRODUODENOSCOPY  06/16/2015   [facility]  . LIPOMA EXCISION    . TONSILLECTOMY    . TRANSURETHRAL RESECTION OF PROSTATE  2018    Social History   Socioeconomic History  . Marital status: Married    Spouse name: Not on file  . Number of children: Not on file  . Years of education: Not on file  . Highest education level: Not on file  Occupational History  . Not on file  Tobacco Use  . Smoking status: Never Smoker  . Smokeless tobacco: Never Used  Vaping Use  . Vaping Use: Never used  Substance and Sexual Activity  . Alcohol use: Not  Currently  . Drug use: Never  . Sexual activity: Yes  Other Topics Concern  . Not on file  Social History Narrative  . Not on file   Social Determinants of Health   Financial Resource Strain:   . Difficulty of Paying Living Expenses:   Food Insecurity:   . Worried About Charity fundraiser in the Last Year:   . Arboriculturist in the Last Year:   Transportation Needs:   . Film/video editor (Medical):   Marland Kitchen Lack of Transportation (Non-Medical):   Physical Activity:   . Days of Exercise per Week:   . Minutes of Exercise per Session:   Stress:   . Feeling of Stress :   Social Connections:   . Frequency of Communication with Friends and Family:   . Frequency of Social Gatherings with Friends and Family:   . Attends Religious Services:   . Active Member of Clubs or Organizations:   . Attends Archivist Meetings:   Marland Kitchen Marital Status:   Intimate Partner Violence:   . Fear of Current or Ex-Partner:   . Emotionally Abused:   Marland Kitchen Physically Abused:   . Sexually Abused:  Family History  Problem Relation Age of Onset  . Heart disease Mother   . Heart disease Father   . Hearing loss Father   . Hypertension Father   . Heart disease Sister   . Hypertension Sister   . Heart disease Brother   . Hypertension Brother   . Heart disease Maternal Grandfather   . Heart disease Paternal Grandfather      Immunization History  Administered Date(s) Administered  . Hep A / Hep B 09/25/2011    Outpatient Encounter Medications as of 09/19/2019  Medication Sig  . levothyroxine (EUTHYROX) 75 MCG tablet TAKE 1 TABLET BY MOUTH ONCE DAILY IN THE MORNING  . Omega-3 Fatty Acids (FISH OIL) 1000 MG CAPS Take by mouth daily.  . [DISCONTINUED] EUTHYROX 75 MCG tablet TAKE 1 TABLET BY MOUTH ONCE DAILY IN THE MORNING (PLEASE SCHEDULE WITH NEW PROVIDER FOR FUTURE FILLS)  . [DISCONTINUED] predniSONE (STERAPRED UNI-PAK 21 TAB) 10 MG (21) TBPK tablet As directed   No facility-administered  encounter medications on file as of 09/19/2019.     ROS: Pertinent positives and negatives noted in HPI. Remainder of ROS non-contributory    Allergies  Allergen Reactions  . Bactrim [Sulfamethoxazole-Trimethoprim] Other (See Comments)    Fever     BP 110/70 (BP Location: Left Arm, Patient Position: Sitting, Cuff Size: Normal)   Pulse 81   Temp (!) 97.3 F (36.3 C) (Temporal)   Ht 6' 2.5" (1.892 m)   Wt 191 lb 6.4 oz (86.8 kg)   SpO2 98%   BMI 24.25 kg/m   Physical Exam Vitals reviewed.  Constitutional:      General: He is not in acute distress.    Appearance: Normal appearance. He is normal weight. He is not ill-appearing.  Eyes:     Extraocular Movements: Extraocular movements intact.     Conjunctiva/sclera: Conjunctivae normal.  Neck:     Thyroid: No thyroid mass, thyromegaly or thyroid tenderness.  Cardiovascular:     Rate and Rhythm: Normal rate and regular rhythm.     Pulses: Normal pulses.     Heart sounds: Normal heart sounds.  Pulmonary:     Effort: Pulmonary effort is normal. No respiratory distress.     Breath sounds: Normal breath sounds. No wheezing or rhonchi.  Abdominal:     General: Bowel sounds are normal. There is no distension.     Palpations: Abdomen is soft.     Tenderness: There is no abdominal tenderness. There is no guarding.  Musculoskeletal:     Cervical back: Normal range of motion and neck supple.  Skin:    General: Skin is warm and dry.  Neurological:     General: No focal deficit present.     Mental Status: He is alert and oriented to person, place, and time.  Psychiatric:        Mood and Affect: Mood normal.        Behavior: Behavior normal.      A/P:   1. Acquired hypothyroidism - stable Refill: - levothyroxine (EUTHYROX) 75 MCG tablet; TAKE 1 TABLET BY MOUTH ONCE DAILY IN THE MORNING  Dispense: 90 tablet; Refill: 3 - TSH - T4, free  2. Annual physical exam - strongly encouraged pt to get covid vaccine and info  included in AVS about where he can do so; declines shingrix vaccine and states he has pneumonia vaccines - UTD on colonoscopy - discussed importance of regular CV exercise, healthy diet, adequate sleep - ALT - AST -  Basic metabolic panel - Lipid panel - next CPE in 1 year  3. Screening for prostate cancer - PSA, total and free - per pt request  4. Elevated fasting glucose - Hemoglobin A1c  5. Dysphagia, unspecified type - h/o esophageal dilation in 2017 - Ambulatory referral to Gastroenterology

## 2019-09-19 NOTE — Patient Instructions (Signed)
Get Your Vaccine - All Phases Now Eligible Everyone 28 and older who wants a COVID-19 vaccine can get one through [facility].  Please review our clinic sites below for vaccine type and associated age requirement before scheduling.  [place]  ([place]) [place] [employer] [address] [place], [place] [address] Hours: Thu: 1p-5p Type: Moderna (18+)   [place]  ([place]) [address] [place], [place] [address] Hours: Thu-Sat 10a-5p Type: Gross (16+)  [place]  [facility]) [employer] [address] [place], [place] [address] Hours: Thu-Sat 8a-12p Type: Pfizer (16+)  [place]  [facility]) [employer]. [place], [place] [address] Hours: Thu-Fri 8a-12p Type: Moderna (18+)  Schedule Your FREE Vaccine Appointment at [email or web address]  WALK-INS PERMITTED: Please arrive two hours before clinic closing if you do not have an appointment.  Phone Assistance: Please call [phone], Monday through Friday between 7 a.m. and 7 p.m. Other Vaccine Locations: Find all vaccine locations throughout the state of [place] at [email or web address]  WALK-INS PERMITTED: Please arrive two hours before clinic closing if you do not have an appointment.  Phone Assistance: Please call [phone], Monday through Friday between 7 a.m. and 7 p.m. Other Vaccine Locations: Find all vaccine locations throughout the state of [place] at [email or web address]  [employer] Vaccine Following federal and state guidance, [place] plans to offer the [employer] vaccine in weeks ahead at select locations. [place] has no scheduled clinics where the [facility] vaccine will be offered at this time. The CDC's recommendation for vaccine providers to  resume use of the [employer] vaccine is available at [email or web address]

## 2019-09-20 LAB — BASIC METABOLIC PANEL
BUN/Creatinine Ratio: 11 (ref 10–24)
BUN: 13 mg/dL (ref 8–27)
CO2: 24 mmol/L (ref 20–29)
Calcium: 9.3 mg/dL (ref 8.6–10.2)
Chloride: 105 mmol/L (ref 96–106)
Creatinine, Ser: 1.17 mg/dL (ref 0.76–1.27)
GFR calc Af Amer: 72 mL/min/{1.73_m2} (ref >59)
GFR calc non Af Amer: 62 mL/min/{1.73_m2} (ref >59)
Glucose: 85 mg/dL (ref 65–99)
Potassium: 4.7 mmol/L (ref 3.5–5.2)
Sodium: 143 mmol/L (ref 134–144)

## 2019-09-20 LAB — LIPID PANEL
Chol/HDL Ratio: 4.5 ratio (ref 0.0–5.0)
Cholesterol, Total: 152 mg/dL (ref 100–199)
HDL: 34 mg/dL — ABNORMAL LOW (ref >39)
LDL Chol Calc (NIH): 74 mg/dL (ref 0–99)
Triglycerides: 271 mg/dL — ABNORMAL HIGH (ref 0–149)
VLDL Cholesterol Cal: 44 mg/dL — ABNORMAL HIGH (ref 5–40)

## 2019-09-20 LAB — PSA, TOTAL AND FREE
PSA, Free Pct: 43.3 %
PSA, Free: 0.52 ng/mL
Prostate Specific Ag, Serum: 1.2 ng/mL (ref 0.0–4.0)

## 2019-09-20 LAB — TSH: TSH: 2.8 u[IU]/mL (ref 0.450–4.500)

## 2019-09-20 LAB — AST: AST: 20 IU/L (ref 0–40)

## 2019-09-20 LAB — T4, FREE: Free T4: 1.27 ng/dL (ref 0.82–1.77)

## 2019-09-20 LAB — HEMOGLOBIN A1C
Est. average glucose Bld gHb Est-mCnc: 94 mg/dL
Hgb A1c MFr Bld: 4.9 % (ref 4.8–5.6)

## 2019-09-20 LAB — ALT: ALT: 22 IU/L (ref 0–44)

## 2019-09-24 ENCOUNTER — Encounter: Payer: Self-pay | Admitting: Family Medicine

## 2019-09-30 ENCOUNTER — Encounter: Payer: Self-pay | Admitting: Gastroenterology

## 2019-10-06 ENCOUNTER — Other Ambulatory Visit: Payer: Self-pay | Admitting: Family Medicine

## 2019-10-06 DIAGNOSIS — E039 Hypothyroidism, unspecified: Secondary | ICD-10-CM

## 2019-10-10 ENCOUNTER — Other Ambulatory Visit: Payer: Self-pay | Admitting: Family Medicine

## 2019-10-10 DIAGNOSIS — E039 Hypothyroidism, unspecified: Secondary | ICD-10-CM

## 2019-10-10 NOTE — Telephone Encounter (Signed)
Last OV 09/19/19 Last fill 09/19/19  #90/3

## 2019-11-05 ENCOUNTER — Encounter: Payer: Self-pay | Admitting: Dermatology

## 2019-11-05 ENCOUNTER — Ambulatory Visit: Payer: Medicare HMO | Admitting: Dermatology

## 2019-11-05 ENCOUNTER — Other Ambulatory Visit: Payer: Self-pay

## 2019-11-05 DIAGNOSIS — L570 Actinic keratosis: Secondary | ICD-10-CM | POA: Diagnosis not present

## 2019-11-05 DIAGNOSIS — D1801 Hemangioma of skin and subcutaneous tissue: Secondary | ICD-10-CM | POA: Diagnosis not present

## 2019-11-05 DIAGNOSIS — Z1283 Encounter for screening for malignant neoplasm of skin: Secondary | ICD-10-CM | POA: Diagnosis not present

## 2019-11-05 DIAGNOSIS — Z85828 Personal history of other malignant neoplasm of skin: Secondary | ICD-10-CM | POA: Diagnosis not present

## 2019-11-05 DIAGNOSIS — L918 Other hypertrophic disorders of the skin: Secondary | ICD-10-CM

## 2019-11-05 DIAGNOSIS — L821 Other seborrheic keratosis: Secondary | ICD-10-CM | POA: Diagnosis not present

## 2019-11-05 NOTE — Patient Instructions (Addendum)
Routine follow-up for Nathan [name] date of birth [birth year].  There is no recurrence of the skin cancer near his left ear.  From legs to scalp there are no atypical moles.  [name] was kind enough to let me discuss information relating to current Covid vaccines and he promised he would think this over.  He has 3 dozen 2 to 3 mm red dots on his abdomen and back call cherry angiomas which are harmless. there is small skin tag on the left arm which requires no treatment.  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  He was able to reach and feel a textured rough spot on the outer upper right back; this is a seborrheic keratosis and he can expect a few more these in the next 20 years but these do not require removal.  He still does have some pink gritty scale on the left cheek more than the forehead and more than the ears.  Although these are commonly called precancer the actual risk of skin cancer is small.  None of them currently require freezing or biopsy.  He is a candidate to consider either topical fluorouracil or PDT therapy in the late fall or winter.

## 2019-11-17 ENCOUNTER — Encounter: Payer: Self-pay | Admitting: Gastroenterology

## 2019-11-17 ENCOUNTER — Ambulatory Visit: Payer: Medicare HMO | Admitting: Gastroenterology

## 2019-11-17 VITALS — BP 116/72 | HR 76 | Ht 75.0 in | Wt 196.2 lb

## 2019-11-17 DIAGNOSIS — R1310 Dysphagia, unspecified: Secondary | ICD-10-CM

## 2019-11-17 DIAGNOSIS — Z01818 Encounter for other preprocedural examination: Secondary | ICD-10-CM

## 2019-11-17 DIAGNOSIS — R12 Heartburn: Secondary | ICD-10-CM

## 2019-11-17 NOTE — Patient Instructions (Addendum)
If you are age 71 or older, your body mass index should be between 23-30. Your Body mass index is 24.53 kg/m. If this is out of the aforementioned range listed, please consider follow up with your Primary Care Provider.  If you are age 97 or younger, your body mass index should be between 19-25. Your Body mass index is 24.53 kg/m. If this is out of the aformentioned range listed, please consider follow up with your Primary Care Provider.   You have been scheduled for an endoscopy. Please follow written instructions given to you at your visit today. If you use inhalers (even only as needed), please bring them with you on the day of your procedure.  It was a pleasure to see you today!  Vito Cirigliano, D.O.

## 2019-11-17 NOTE — Progress Notes (Signed)
Chief Complaint: Dysphagia  Referring Provider:     Ronnald Nian, DO   HPI:    Nathan [name] is a 69 y.o. male with history of hypothyroidism, HCV) treatment with SVR in 2015 at [facility] referred to the [facility]enterology [facility] for evaluation of dysphagia. Sxs have been present for a number of years, but worse of the last few months. Points to mid chest. Will resolve eventually with walking around, bouncing on heels. Worse with meat. No liquid dysphagia. No odynophagia. Rare episodes of HB; doesn't take any meds for this.    Endoscopic history: -EGD (2017, [facility]): Unsure of results, but esophageal dilation performed with patient -Colonoscopy (04/2016, [facility]): Normal per patient, repeat 10 years  Father with similar hx of dysphagia in his 2's, but o/w no known family history of CRC, GI malignancy, liver disease, pancreatic disease, or IBD.     Past Medical History:  Diagnosis Date  . Chronic pain of both knees 03/18/2018  . Colon polyps   . GERD (gastroesophageal reflux disease)   . Hepatitis C    s/p tx with Harvoni  . IBS (irritable bowel syndrome)   . Skin cancer   . Thyroid disease      Past Surgical History:  Procedure Laterality Date  . BUNIONECTOMY Right   . COLONOSCOPY  05/02/2016   [facility]  . ESOPHAGOGASTRODUODENOSCOPY  06/16/2015   [facility]  . LIPOMA EXCISION    . TONSILLECTOMY    . TRANSURETHRAL RESECTION OF PROSTATE  2018   Family History  Problem Relation Age of Onset  . Heart disease Mother   . Heart disease Father   . Hearing loss Father   . Hypertension Father   . Heart disease Sister   . Hypertension Sister   . Heart disease Brother   . Hypertension Brother   . Heart disease Maternal Grandfather   . Heart disease Paternal Grandfather   . Colon cancer Neg Hx   . Esophageal cancer Neg Hx    Social History   Tobacco Use  . Smoking status:  Never Smoker  . Smokeless tobacco: Never Used  Vaping Use  . Vaping Use: Never used  Substance Use Topics  . Alcohol use: Not Currently  . Drug use: Never   Current Outpatient Medications  Medication Sig Dispense Refill  . EUTHYROX 75 MCG tablet TAKE 1 TABLET BY MOUTH ONCE DAILY IN THE MORNING. PLEASE SCHEDULE WITH NEW PROVIDER FOR FUTURE FILLS. 30 tablet 0  . Omega-3 Fatty Acids (FISH OIL) 1000 MG CAPS Take by mouth as needed (Takes when he can remember it).      No current facility-administered medications for this visit.   Allergies  Allergen Reactions  . Bactrim [Sulfamethoxazole-Trimethoprim] Other (See Comments)    Fever      Review of Systems: All systems reviewed and negative except where noted in HPI.     Physical Exam:    Wt Readings from Last 3 Encounters:  11/17/19 196 lb 4 oz (89 kg)  09/19/19 191 lb 6.4 oz (86.8 kg)  10/02/18 194 lb 9.6 oz (88.3 kg)    BP 116/72   Pulse 76   Ht 6\' 3"  (1.905 m)   Wt 196 lb 4 oz (89 kg)   BMI 24.53 kg/m  Constitutional:  Pleasant, in no acute distress. Psychiatric: Normal mood and affect. Behavior is normal. EENT: Pupils  normal.  Conjunctivae are normal. No scleral icterus. Neck supple. No cervical LAD. Cardiovascular: Normal rate, regular rhythm. No edema Pulmonary/chest: Effort normal and breath sounds normal. No wheezing, rales or rhonchi. Abdominal: Soft, nondistended, nontender. Bowel sounds active throughout. There are no masses palpable. No hepatomegaly. Neurological: Alert and oriented to person place and time. Skin: Skin is warm and dry. No rashes noted.   ASSESSMENT AND PLAN;   1) Dysphagia 2) Heartburn -Plan for EGD with esophageal dilation and possible esophageal biopsies -Evaluate for evidence of reflux, erosive esophagitis, LES laxity, etc. at time of EGD -Cut food into small pieces, eat small bites, chew food thoroughly and with plenty of liquids to avoid food impaction.  The indications, risks,  and benefits of EGD were explained to the patient in detail. Risks include but are not limited to bleeding, perforation, adverse reaction to medications, and cardiopulmonary compromise. Sequelae include but are not limited to the possibility of surgery, hositalization, and mortality. The patient verbalized understanding and wished to proceed. All questions answered, referred to scheduler. Further recommendations pending results of the exam.    Nathan Bullion, DO, FACG  11/17/2019, 10:35 AM   Nathan [name], Nathan Fila, DO

## 2019-11-18 ENCOUNTER — Encounter: Payer: Self-pay | Admitting: Gastroenterology

## 2019-11-19 ENCOUNTER — Ambulatory Visit (INDEPENDENT_AMBULATORY_CARE_PROVIDER_SITE_OTHER): Payer: Medicare HMO

## 2019-11-19 DIAGNOSIS — Z1159 Encounter for screening for other viral diseases: Secondary | ICD-10-CM

## 2019-11-19 LAB — SARS CORONAVIRUS 2 (TAT 6-24 HRS): SARS Coronavirus 2: NEGATIVE

## 2019-11-21 ENCOUNTER — Encounter: Payer: Self-pay | Admitting: Gastroenterology

## 2019-11-21 ENCOUNTER — Other Ambulatory Visit: Payer: Self-pay

## 2019-11-21 ENCOUNTER — Ambulatory Visit (AMBULATORY_SURGERY_CENTER): Payer: Medicare HMO | Admitting: Gastroenterology

## 2019-11-21 VITALS — BP 115/75 | HR 65 | Temp 99.1°F | Resp 11 | Ht 75.0 in | Wt 196.0 lb

## 2019-11-21 DIAGNOSIS — K2100 Gastro-esophageal reflux disease with esophagitis, without bleeding: Secondary | ICD-10-CM | POA: Diagnosis not present

## 2019-11-21 DIAGNOSIS — R1310 Dysphagia, unspecified: Secondary | ICD-10-CM | POA: Diagnosis not present

## 2019-11-21 DIAGNOSIS — K222 Esophageal obstruction: Secondary | ICD-10-CM

## 2019-11-21 DIAGNOSIS — K219 Gastro-esophageal reflux disease without esophagitis: Secondary | ICD-10-CM | POA: Diagnosis not present

## 2019-11-21 MED ORDER — SODIUM CHLORIDE 0.9 % IV SOLN: 500.0000 mL | Freq: Once | INTRAVENOUS | Status: DC

## 2019-11-21 MED ORDER — PANTOPRAZOLE SODIUM 40 MG PO TBEC: 40.0000 mg | DELAYED_RELEASE_TABLET | Freq: Two times a day (BID) | ORAL | 1 refills | Status: DC

## 2019-11-21 NOTE — Progress Notes (Signed)
Called to room to assist during endoscopic procedure.  Patient ID and intended procedure confirmed with present staff. Received instructions for my participation in the procedure from the performing physician.  

## 2019-11-21 NOTE — Progress Notes (Signed)
A and O x3. Report to RN. Tolerated MAC anesthesia well.Teeth unchanged after procedure.

## 2019-11-21 NOTE — Patient Instructions (Signed)
Take your new medication as ordered.  Follow the diet for today.  Read all handouts given to you by your recovery room nurse.  See your discharge instructions for your repeat endoscopy and the dates.  thanbk-you for choosing [place] for your healthcare needs today.  YOU HAD AN ENDOSCOPIC PROCEDURE TODAY AT [facility]:   Refer to the procedure report that was given to you for any specific questions about what was found during the examination.  If the procedure report does not answer your questions, please call your gastroenterologist to clarify.  If you requested that your care partner not be given the details of your procedure findings, then the procedure report has been included in a sealed envelope for you to review at your convenience later.  Please Note:  You might notice some irritation and congestion in your nose or some drainage.  This is from the oxygen used during your procedure.  There is no need for concern and it should clear up in a day or so.  SYMPTOMS TO REPORT IMMEDIATELY:    Following upper endoscopy (EGD)  Vomiting of blood or coffee ground material  New chest pain or pain under the shoulder blades  Painful or persistently difficult swallowing  New shortness of breath  Fever of 100F or higher  Black, tarry-looking stools  For urgent or emergent issues, a gastroenterologist can be reached at any hour by calling [phone]. Do not use MyChart messaging for urgent concerns.    DIET:  We do recommend clear liquids until 11:30am.  Then have a soft diet for the rest of today. Tomorrow, you may proceed to your regular diet.  Drink plenty of fluids but you should avoid alcoholic beverages for 24 hours.  ACTIVITY:  You should plan to take it easy for the rest of today and you should NOT DRIVE or use heavy machinery until tomorrow (because of the sedation medicines used during the test).    FOLLOW UP: Our staff will call the number listed on your records 48-72  hours following your procedure to check on you and address any questions or concerns that you may have regarding the information given to you following your procedure. If we do not reach you, we will leave a message.  We will attempt to reach you two times.  During this call, we will ask if you have developed any symptoms of COVID 19. If you develop any symptoms (ie: fever, flu-like symptoms, shortness of breath, cough etc.) before then, please call [phone].  If you test positive for Covid 19 in the 2 weeks post procedure, please call and report this information to [place].    If any biopsies were taken you will be contacted by phone or by letter within the next 1-3 weeks.  Please call us at [phone] if you have not heard about the biopsies in 3 weeks.    SIGNATURES/CONFIDENTIALITY: You and/or your care partner have signed paperwork which will be entered into your electronic medical record.  These signatures attest to the fact that that the information above on your After Visit Summary has been reviewed and is understood.  Full responsibility of the confidentiality of this discharge information lies with you and/or your care-partner.

## 2019-11-21 NOTE — Op Note (Signed)
[place] Patient Name: Nathan [name] Procedure Date: 11/21/2019 9:46 AM MRN: [id number] Endoscopist: Gerrit Heck , MD Age: 69 Referring MD:  Date of Birth: 08/16/1950 Gender: Male Account #: [id number] Procedure:                Upper GI endoscopy Indications:              Dysphagia, , Heartburn                           EGD at [facility]SH in 2017 notable for stricture requiring                            esophageal dilation. Now with recurrence of                            progressive solid food dysphagia. Also with                            intermittent heartburn, not currently taking any                            acid suppression therapy. Medicines:                Monitored Anesthesia Care Procedure:                Pre-Anesthesia Assessment:                           - Prior to the procedure, a History and Physical                            was performed, and patient medications and                            allergies were reviewed. The patient's tolerance of                            previous anesthesia was also reviewed. The risks                            and benefits of the procedure and the sedation                            options and risks were discussed with the patient.                            All questions were answered, and informed consent                            was obtained. Prior Anticoagulants: The patient has                            taken no previous anticoagulant or antiplatelet  agents. ASA Grade Assessment: II - A patient with                            mild systemic disease. After reviewing the risks                            and benefits, the patient was deemed in                            satisfactory condition to undergo the procedure.                           After obtaining informed consent, the endoscope was                            passed under direct vision. Throughout the                             procedure, the patient's blood pressure, pulse, and                            oxygen saturations were monitored continuously. The                            Endoscope was introduced through the mouth, and                            advanced to the second part of duodenum. The upper                            GI endoscopy was accomplished without difficulty.                            The patient tolerated the procedure well. Scope In: Scope Out: Findings:                 One benign-appearing, intrinsic moderate stenosis                            was found 44 cm from the incisors. This stenosis                            measured 9 mm (inner diameter) x 1 cm (in length).                            There was mild esophagitis noted, characterized by                            erythema and edema. The stenosis was traversed. A                            TTS dilator was passed through the scope. Dilation  with an 01-16-12 mm balloon and a 13.5-14.5-15.5 mm                            balloon dilator was performed to 14.5 mm. The                            dilation site was examined and showed mild mucosal                            disruption and moderate improvement in luminal                            narrowing. Biopsies were then taken with a cold                            forceps for further fracturing of the stricture and                            specimen sent for histology. Estimated blood loss                            was minimal.                           The upper third of the esophagus and middle third                            of the esophagus were normal.                           The entire examined stomach was normal.                           The duodenal bulb, first portion of the duodenum                            and second portion of the duodenum were normal. Complications:            No immediate complications. Estimated Blood Loss:      Estimated blood loss was minimal. Impression:               - Benign-appearing esophageal stenosis. Dilated to                            14.5 mm TTS balloon then fractured with cold                            forceps.                           - Mild esophagitis. Biopsied.                           - Normal upper third of esophagus and middle third  of esophagus.                           - Normal stomach.                           - Normal duodenal bulb, first portion of the                            duodenum and second portion of the duodenum. Recommendation:           - Patient has a contact number available for                            emergencies. The signs and symptoms of potential                            delayed complications were discussed with the                            patient. Return to normal activities tomorrow.                            Written discharge instructions were provided to the                            patient.                           - Resume previous diet.                           - Continue present medications.                           - Await pathology results.                           - Use Protonix (pantoprazole) 40 mg PO BID for 8                            weeks to promote mucosal healing and for                            therapeutic intent, with plan to reduce to lowest                            effective dose for ongoing control of reflux                            symptoms (heartburn).                           - Repeat upper endoscopy in 6 weeks for retreatment. Gerrit Heck, MD 11/21/2019 10:18:18 AM

## 2019-11-21 NOTE — Progress Notes (Signed)
Lidocaine buffer  Robinol antisialogogue

## 2019-11-21 NOTE — Progress Notes (Signed)
VS by Lemmon. 

## 2019-11-25 ENCOUNTER — Telehealth: Payer: Self-pay

## 2019-11-25 NOTE — Telephone Encounter (Signed)
  Follow up Call-  Call back number 11/21/2019  Post procedure Call Back phone  # [phone]  Permission to leave phone message Yes     Patient questions:  Do you have a fever, pain , or abdominal swelling? No. Pain Score  0 *  Have you tolerated food without any problems? Yes.    Have you been able to return to your normal activities? Yes.    Do you have any questions about your discharge instructions: Diet   No. Medications  No. Follow up visit  No.  Do you have questions or concerns about your Care? No.  Actions: * If pain score is 4 or above: 1. No action needed, pain <4.Have you developed a fever since your procedure? no  2.   Have you had an respiratory symptoms (SOB or cough) since your procedure? no  3.   Have you tested positive for COVID 19 since your procedure no  4.   Have you had any family members/close contacts diagnosed with the COVID 19 since your procedure?  no   If yes to any of these questions please route to Joylene Kholton, RN and Joella Prince, RN

## 2019-11-30 ENCOUNTER — Encounter: Payer: Self-pay | Admitting: Dermatology

## 2019-11-30 NOTE — Progress Notes (Signed)
   Follow-Up Visit   Subjective  Nathan [name] is a 69 y.o. male who presents for the following: Annual Exam (skin check).  General skin check Location: Crusts left cheek Duration:  Quality:  Associated Signs/Symptoms: Modifying Factors:  Severity:  Timing: Context: History of skin cancer face  Objective  Well appearing patient in no apparent distress; mood and affect are within normal limits.  A full examination was performed including scalp, head, eyes, ears, nose, lips, neck, chest, axillae, abdomen, back, buttocks, bilateral upper extremities, bilateral lower extremities, hands, feet, fingers, toes, fingernails, and toenails. All findings within normal limits unless otherwise noted below.   Assessment & Plan    Screening for malignant neoplasm of skin Mid Back  Annual skin examination; self examine skin twice annually.  AK (actinic keratosis) (2) Left Buccal Cheek ; Right Buccal Cheek   PDT or [place] in the late fall/winter  Routine follow-up for Nathan [name] date of birth 1950-05-07.  There is no recurrence of the skin cancer near his left ear.  From legs to scalp there are no atypical moles.  Sho was kind enough to let me discuss information relating to current Covid vaccines and he promised he would think this over.  He has 3 dozen 2 to 3 mm red dots on his abdomen and back call cherry angiomas which are harmless. there is small skin tag on the left arm which requires no treatment.  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  He was able to reach and feel a textured rough spot on the outer upper right back; this is a seborrheic keratosis and he can expect a few more these in the next 20 years but these do not require removal.  He still does have some pink gritty scale on the left cheek more than the forehead and more than the ears.  Although these are commonly called precancer the actual risk of  skin cancer is small.  None of them currently require freezing or biopsy.  He is a candidate to consider either topical fluorouracil or PDT therapy in the late fall or winter.     I, Lavonna Monarch, MD, have reviewed all documentation for this visit.  The documentation on 11/30/19 for the exam, diagnosis, procedures, and orders are all accurate and complete.

## 2019-12-11 ENCOUNTER — Encounter: Payer: Self-pay | Admitting: Gastroenterology

## 2019-12-15 NOTE — Progress Notes (Signed)
Subjective:   Nathan [name] is a 69 y.o. male who presents for an Initial Medicare Annual Wellness Visit.   I connected with Carron today by telephone and verified that I am speaking with the correct person using two identifiers. Location patient: home Location provider: work Persons participating in the virtual visit: patient, Marine scientist.    I discussed the limitations, risks, security and privacy concerns of performing an evaluation and management service by telephone and the availability of in person appointments. I also discussed with the patient that there may be a patient responsible charge related to this service. The patient expressed understanding and verbally consented to this telephonic visit.    Interactive audio and video telecommunications were attempted between this provider and patient, however failed, due to patient having technical difficulties OR patient did not have access to video capability.  We continued and completed visit with audio only.  Some vital signs may be absent or patient reported.   Time Spent with patient on telephone encounter: 20 minutes  Review of Systems     Cardiac Risk Factors include: advanced age (>71men, >62 women);male gender     Objective:    Today's Vitals   12/16/19 1115  Weight: 196 lb (88.9 kg)  Height: 6\' 2"  (1.88 m)   Body mass index is 25.16 kg/m.  Advanced Directives 12/16/2019 11/21/2019  Does Patient Have a Medical Advance Directive? Yes No  Type of Paramedic of [place];Living will -  Copy of [place] in Chart? No - copy requested -  Would patient like information on creating a medical advance directive? - No - Patient declined    Current Medications (verified) Outpatient Encounter Medications as of 12/16/2019  Medication Sig  . EUTHYROX 75 MCG tablet TAKE 1 TABLET BY MOUTH ONCE DAILY IN THE MORNING. PLEASE SCHEDULE WITH NEW PROVIDER FOR FUTURE FILLS.  Marland Kitchen Omega-3 Fatty Acids (FISH  OIL) 1000 MG CAPS Take by mouth as needed (Takes when he can remember it).   . pantoprazole (PROTONIX) 40 MG tablet Take 1 tablet (40 mg total) by mouth 2 (two) times daily.   No facility-administered encounter medications on file as of 12/16/2019.    Allergies (verified) Bactrim [sulfamethoxazole-trimethoprim]   History: Past Medical History:  Diagnosis Date  . Chronic pain of both knees 03/18/2018  . Colon polyps   . GERD (gastroesophageal reflux disease)   . Hepatitis C    s/p tx with Harvoni  . IBS (irritable bowel syndrome)   . Skin cancer   . Thyroid disease    Past Surgical History:  Procedure Laterality Date  . BUNIONECTOMY Right   . COLONOSCOPY  05/02/2016   [facility]  . ESOPHAGOGASTRODUODENOSCOPY  06/16/2015   [facility]  . LIPOMA EXCISION    . TONSILLECTOMY    . TRANSURETHRAL RESECTION OF PROSTATE  2018   Family History  Problem Relation Age of Onset  . Heart disease Mother   . Heart disease Father   . Hearing loss Father   . Hypertension Father   . Heart disease Sister   . Hypertension Sister   . Heart disease Brother   . Hypertension Brother   . Heart disease Maternal Grandfather   . Heart disease Paternal Grandfather   . Colon cancer Neg Hx   . Esophageal cancer Neg Hx   . Rectal cancer Neg Hx   . Stomach cancer Neg Hx    Social History   Socioeconomic History  .  Marital status: Married    Spouse name: Not on file  . Number of children: Not on file  . Years of education: Not on file  . Highest education level: Not on file  Occupational History  . Not on file  Tobacco Use  . Smoking status: Never Smoker  . Smokeless tobacco: Never Used  Vaping Use  . Vaping Use: Never used  Substance and Sexual Activity  . Alcohol use: Not Currently  . Drug use: Never  . Sexual activity: Yes  Other Topics Concern  . Not on file  Social History Narrative  . Not on file   Social Determinants of Health   Financial  Resource Strain: Low Risk   . Difficulty of Paying Living Expenses: Not hard at all  Food Insecurity: No Food Insecurity  . Worried About Charity fundraiser in the Last Year: Never true  . Ran Out of Food in the Last Year: Never true  Transportation Needs: No Transportation Needs  . Lack of Transportation (Medical): No  . Lack of Transportation (Non-Medical): No  Physical Activity: Sufficiently Active  . Days of Exercise per Week: 4 days  . Minutes of Exercise per Session: 40 min  Stress: No Stress Concern Present  . Feeling of Stress : Not at all  Social Connections: Socially Integrated  . Frequency of Communication with Friends and Family: More than three times a week  . Frequency of Social Gatherings with Friends and Family: More than three times a week  . Attends Religious Services: 1 to 4 times per year  . Active Member of Clubs or Organizations: Yes  . Attends Archivist Meetings: 1 to 4 times per year  . Marital Status: Married    Tobacco Counseling Counseling given: Not Answered   Clinical Intake:  Pre-visit preparation completed: Yes  Pain : No/denies pain     Nutritional Status: BMI 25 -29 Overweight Nutritional Risks: None Diabetes: No  How often do you need to have someone help you when you read instructions, pamphlets, or other written materials from your doctor or pharmacy?: 1 - Never What is the last grade level you completed in school?: some college  Diabetic?No  Interpreter Needed?: No  Information entered by :: Caroleen Hamman LPN   Activities of Daily Living In your present state of health, do you have any difficulty performing the following activities: 12/16/2019  Hearing? N  Vision? N  Difficulty concentrating or making decisions? N  Walking or climbing stairs? N  Dressing or bathing? N  Doing errands, shopping? N  Preparing Food and eating ? N  Using the Toilet? N  In the past six months, have you accidently leaked urine? N    Do you have problems with loss of bowel control? N  Managing your Medications? N  Managing your Finances? N  Housekeeping or managing your Housekeeping? N  Some recent data might be hidden    Patient Care Team: Ronnald Nian, DO as PCP - General (Family Medicine) Lavonna Monarch, MD as Consulting Physician (Dermatology)  Indicate any recent Medical Services you may have received from other than [facility] providers in the past year (date may be approximate).     Assessment:   This is a routine wellness examination for [place].  Hearing/Vision screen  Hearing Screening   125Hz  250Hz  500Hz  1000Hz  2000Hz  3000Hz  4000Hz  6000Hz  8000Hz   Right ear:           Left ear:  Comments: No issues  Vision Screening Comments: Wears glasses Last eye exam-06/2019- Dr.Daughtery  Dietary issues and exercise activities discussed: Current Exercise Habits: Home exercise routine, Type of exercise: walking, Time (Minutes): 40, Frequency (Times/Week): 4, Weekly Exercise (Minutes/Week): 160, Intensity: Mild, Exercise limited by: None identified  Goals    . Patient Stated     Continue walking daily      Depression Screen PHQ 2/9 Scores 12/16/2019 10/02/2018  PHQ - 2 Score 0 0    Fall Risk Fall Risk  12/16/2019 10/02/2018 03/26/2018  Falls in the past year? 0 0 0  Number falls in past yr: 0 - -  Injury with Fall? 0 - -  Follow up Falls prevention discussed Falls evaluation completed -    Any stairs in or around the home? Yes  If so, are there any without handrails? No  Home free of loose throw rugs in walkways, pet beds, electrical cords, etc? Yes  Adequate lighting in your home to reduce risk of falls? Yes   ASSISTIVE DEVICES UTILIZED TO PREVENT FALLS:  Life alert? No  Use of a cane, walker or w/c? No  Grab bars in the bathroom? No  Shower chair or bench in shower? No  Elevated toilet seat or a handicapped toilet? No   TIMED UP AND GO:  Was the test performed? No . Phone  visit   Cognitive Function:No cognitive impairment noted.        Immunizations Immunization History  Administered Date(s) Administered  . Hep A / Hep B 09/25/2011    TDAP status: Due, Education has been provided regarding the importance of this vaccine. Advised may receive this vaccine at local pharmacy or Health Dept. Aware to provide a copy of the vaccination record if obtained from local pharmacy or Health Dept. Verbalized acceptance and understanding.   Flu Vaccine status: Declined, Education has been provided regarding the importance of this vaccine but patient still declined. Advised may receive this vaccine at local pharmacy or Health Dept. Aware to provide a copy of the vaccination record if obtained from local pharmacy or Health Dept. Verbalized acceptance and understanding.   Pneumococcal vaccine status: Declined,  Education has been provided regarding the importance of this vaccine but patient still declined. Advised may receive this vaccine at local pharmacy or Health Dept. Aware to provide a copy of the vaccination record if obtained from local pharmacy or Health Dept. Verbalized acceptance and understanding.  Patient states he has had a pneumonia vaccine at some point in the past. Date unknown. No documentation available.  Covid-19 vaccine status: Declined, Education has been provided regarding the importance of this vaccine but patient still declined. Advised may receive this vaccine at local pharmacy or Health Dept.or vaccine clinic. Aware to provide a copy of the vaccination record if obtained from local pharmacy or Health Dept. Verbalized acceptance and understanding.  Qualifies for Shingles Vaccine? Yes   Zostavax completed   Shingrix Completed?: No.    Education has been provided regarding the importance of this vaccine. Patient has been advised to call insurance company to determine out of pocket expense if they have not yet received this vaccine. Advised may also receive  vaccine at local pharmacy or Health Dept. Verbalized acceptance and understanding.  Screening Tests Health Maintenance  Topic Date Due  . COVID-19 Vaccine (1) Never done  . TETANUS/TDAP  Never done  . PNA vac Low Risk Adult (1 of 2 - PCV13) Never done  . INFLUENZA VACCINE  Never done  .  COLONOSCOPY  05/02/2026  . Hepatitis C Screening  Completed    Health Maintenance  Health Maintenance Due  Topic Date Due  . COVID-19 Vaccine (1) Never done  . TETANUS/TDAP  Never done  . PNA vac Low Risk Adult (1 of 2 - PCV13) Never done  . INFLUENZA VACCINE  Never done    Colorectal cancer screening: Completed 05/02/2016. Repeat every 10 years  Lung Cancer Screening: (Low Dose CT Chest recommended if Age 49-80 years, 30 pack-year currently smoking OR have quit w/in 15years.) does not qualify.     Additional Screening:  Hepatitis C Screening: Completed 09/25/2011  Vision Screening: Recommended annual ophthalmology exams for early detection of glaucoma and other disorders of the eye. Is the patient up to date with their annual eye exam?  Yes  Who is the provider or what is the name of the office in which the patient attends annual eye exams? Dr. Aundra Dubin   Dental Screening: Recommended annual dental exams for proper oral hygiene  Community Resource Referral / Chronic Care Management: CRR required this visit?  No   CCM required this visit?  No      Plan:     I have personally reviewed and noted the following in the patient's chart:   . Medical and social history . Use of alcohol, tobacco or illicit drugs  . Current medications and supplements . Functional ability and status . Nutritional status . Physical activity . Advanced directives . List of other physicians . Hospitalizations, surgeries, and ER visits in previous 12 months . Vitals . Screenings to include cognitive, depression, and falls . Referrals and appointments  In addition, I have reviewed and discussed with  patient certain preventive protocols, quality metrics, and best practice recommendations. A written personalized care plan for preventive services as well as general preventive health recommendations were provided to patient.    Due to this being a telephonic visit, the after visit summary with patients personalized plan was offered to patient via mail or my-chart.  Patient would like to access on my-chart.   Marta Antu, LPN   [id number]  Nurse Health Advisor  Nurse Notes: None

## 2019-12-16 ENCOUNTER — Ambulatory Visit (INDEPENDENT_AMBULATORY_CARE_PROVIDER_SITE_OTHER): Payer: Medicare HMO

## 2019-12-16 VITALS — Ht 74.0 in | Wt 196.0 lb

## 2019-12-16 DIAGNOSIS — Z0000 Encounter for general adult medical examination without abnormal findings: Secondary | ICD-10-CM | POA: Diagnosis not present

## 2019-12-16 NOTE — Patient Instructions (Signed)
Nathan [name] , Thank you for taking time to complete your Medicare Wellness Visit. I appreciate your ongoing commitment to your health goals. Please review the following plan we discussed and let me know if I can assist you in the future.   Screening recommendations/referrals: Colonoscopy: Completed 05/02/2016- Due-05/02/2026 Recommended yearly ophthalmology/optometry visit for glaucoma screening and checkup Recommended yearly dental visit for hygiene and checkup  Vaccinations: Influenza vaccine: Declined Pneumococcal vaccine: Per our conversation, completed vaccines.  Tdap vaccine: Declined Shingles vaccine: Declined   Covid-19: Declined  Advanced directives: Pleas bring a copy for your chart  Conditions/risks identified: See problem list  Next appointment: Follow up in one year for your annual wellness visit. 12/21/2020 @ 11:15  Preventive Care 71 Years and Older, Male Preventive care refers to lifestyle choices and visits with your health care provider that can promote health and wellness. What does preventive care include?  A yearly physical exam. This is also called an annual well check.  Dental exams once or twice a year.  Routine eye exams. Ask your health care provider how often you should have your eyes checked.  Personal lifestyle choices, including:  Daily care of your teeth and gums.  Regular physical activity.  Eating a healthy diet.  Avoiding tobacco and drug use.  Limiting alcohol use.  Practicing safe sex.  Taking low doses of aspirin every day.  Taking vitamin and mineral supplements as recommended by your health care provider. What happens during an annual well check? The services and screenings done by your health care provider during your annual well check will depend on your age, overall health, lifestyle risk factors, and family history of disease. Counseling  Your health care provider may ask you questions about your:  Alcohol use.  Tobacco  use.  Drug use.  Emotional well-being.  Home and relationship well-being.  Sexual activity.  Eating habits.  History of falls.  Memory and ability to understand (cognition).  Work and work Statistician. Screening  You may have the following tests or measurements:  Height, weight, and BMI.  Blood pressure.  Lipid and cholesterol levels. These may be checked every 5 years, or more frequently if you are over 75 years old.  Skin check.  Lung cancer screening. You may have this screening every year starting at age 44 if you have a 30-pack-year history of smoking and currently smoke or have quit within the past 15 years.  Fecal occult blood test (FOBT) of the stool. You may have this test every year starting at age 29.  Flexible sigmoidoscopy or colonoscopy. You may have a sigmoidoscopy every 5 years or a colonoscopy every 10 years starting at age 41.  Prostate cancer screening. Recommendations will vary depending on your family history and other risks.  Hepatitis C blood test.  Hepatitis B blood test.  Sexually transmitted disease (STD) testing.  Diabetes screening. This is done by checking your blood sugar (glucose) after you have not eaten for a while (fasting). You may have this done every 1-3 years.  Abdominal aortic aneurysm (AAA) screening. You may need this if you are a current or former smoker.  Osteoporosis. You may be screened starting at age 52 if you are at high risk. Talk with your health care provider about your test results, treatment options, and if necessary, the need for more tests. Vaccines  Your health care provider may recommend certain vaccines, such as:  Influenza vaccine. This is recommended every year.  Tetanus, diphtheria, and acellular pertussis (Tdap, Td)  vaccine. You may need a Td booster every 10 years.  Zoster vaccine. You may need this after age 15.  Pneumococcal 13-valent conjugate (PCV13) vaccine. One dose is recommended after age  65.  Pneumococcal polysaccharide (PPSV23) vaccine. One dose is recommended after age 48. Talk to your health care provider about which screenings and vaccines you need and how often you need them. This information is not intended to replace advice given to you by your health care provider. Make sure you discuss any questions you have with your health care provider. Document Released: 03/19/2015 Document Revised: 11/10/2015 Document Reviewed: 12/22/2014 Elsevier Interactive Patient Education  2017 [place] Prevention in the Home Falls can cause injuries. They can happen to people of all ages. There are many things you can do to make your home safe and to help prevent falls. What can I do on the outside of my home?  Regularly fix the edges of walkways and driveways and fix any cracks.  Remove anything that might make you trip as you walk through a door, such as a raised step or threshold.  Trim any bushes or trees on the path to your home.  Use bright outdoor lighting.  Clear any walking paths of anything that might make someone trip, such as rocks or tools.  Regularly check to see if handrails are loose or broken. Make sure that both sides of any steps have handrails.  Any raised decks and porches should have guardrails on the edges.  Have any leaves, snow, or ice cleared regularly.  Use sand or salt on walking paths during winter.  Clean up any spills in your garage right away. This includes oil or grease spills. What can I do in the bathroom?  Use night lights.  Install grab bars by the toilet and in the tub and shower. Do not use towel bars as grab bars.  Use non-skid mats or decals in the tub or shower.  If you need to sit down in the shower, use a plastic, non-slip stool.  Keep the floor dry. Clean up any water that spills on the floor as soon as it happens.  Remove soap buildup in the tub or shower regularly.  Attach bath mats securely with double-sided  non-slip rug tape.  Do not have throw rugs and other things on the floor that can make you trip. What can I do in the bedroom?  Use night lights.  Make sure that you have a light by your bed that is easy to reach.  Do not use any sheets or blankets that are too big for your bed. They should not hang down onto the floor.  Have a firm chair that has side arms. You can use this for support while you get dressed.  Do not have throw rugs and other things on the floor that can make you trip. What can I do in the kitchen?  Clean up any spills right away.  Avoid walking on wet floors.  Keep items that you use a lot in easy-to-reach places.  If you need to reach something above you, use a strong step stool that has a grab bar.  Keep electrical cords out of the way.  Do not use floor polish or wax that makes floors slippery. If you must use wax, use non-skid floor wax.  Do not have throw rugs and other things on the floor that can make you trip. What can I do with my stairs?  Do not leave  any items on the stairs.  Make sure that there are handrails on both sides of the stairs and use them. Fix handrails that are broken or loose. Make sure that handrails are as long as the stairways.  Check any carpeting to make sure that it is firmly attached to the stairs. Fix any carpet that is loose or worn.  Avoid having throw rugs at the top or bottom of the stairs. If you do have throw rugs, attach them to the floor with carpet tape.  Make sure that you have a light switch at the top of the stairs and the bottom of the stairs. If you do not have them, ask someone to add them for you. What else can I do to help prevent falls?  Wear shoes that:  Do not have high heels.  Have rubber bottoms.  Are comfortable and fit you well.  Are closed at the toe. Do not wear sandals.  If you use a stepladder:  Make sure that it is fully opened. Do not climb a closed stepladder.  Make sure that both  sides of the stepladder are locked into place.  Ask someone to hold it for you, if possible.  Clearly mark and make sure that you can see:  Any grab bars or handrails.  First and last steps.  Where the edge of each step is.  Use tools that help you move around (mobility aids) if they are needed. These include:  Canes.  Walkers.  Scooters.  Crutches.  Turn on the lights when you go into a dark area. Replace any light bulbs as soon as they burn out.  Set up your furniture so you have a clear path. Avoid moving your furniture around.  If any of your floors are uneven, fix them.  If there are any pets around you, be aware of where they are.  Review your medicines with your doctor. Some medicines can make you feel dizzy. This can increase your chance of falling. Ask your doctor what other things that you can do to help prevent falls. This information is not intended to replace advice given to you by your health care provider. Make sure you discuss any questions you have with your health care provider. Document Released: 12/17/2008 Document Revised: 07/29/2015 Document Reviewed: 03/27/2014 Elsevier Interactive Patient Education  2017 [employer].

## 2019-12-28 IMAGING — DX DG KNEE COMPLETE 4+V*L*
5 series · 5 of 5 positions shown · non-contrast
Comparison: None

CLINICAL DATA: Chronic bilateral knee pain

EXAM:
LEFT KNEE - COMPLETE 4+ VIEW

[knee ap (1 of 2)]
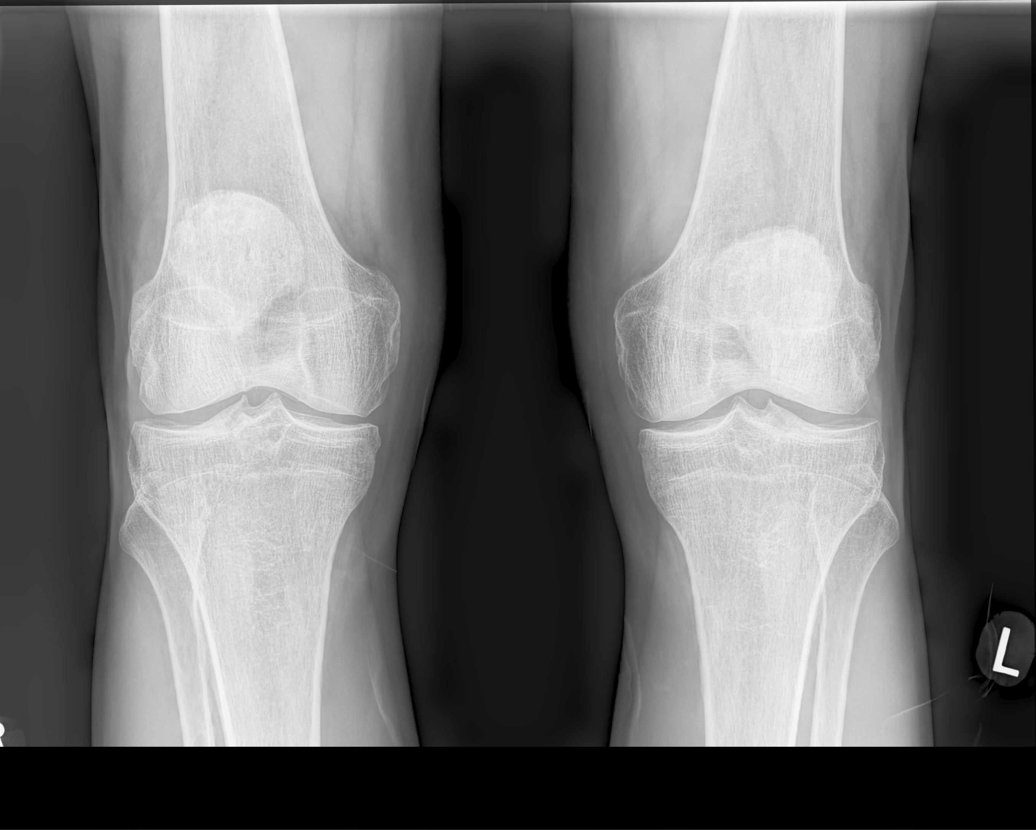

[knee [person_name] view pa]
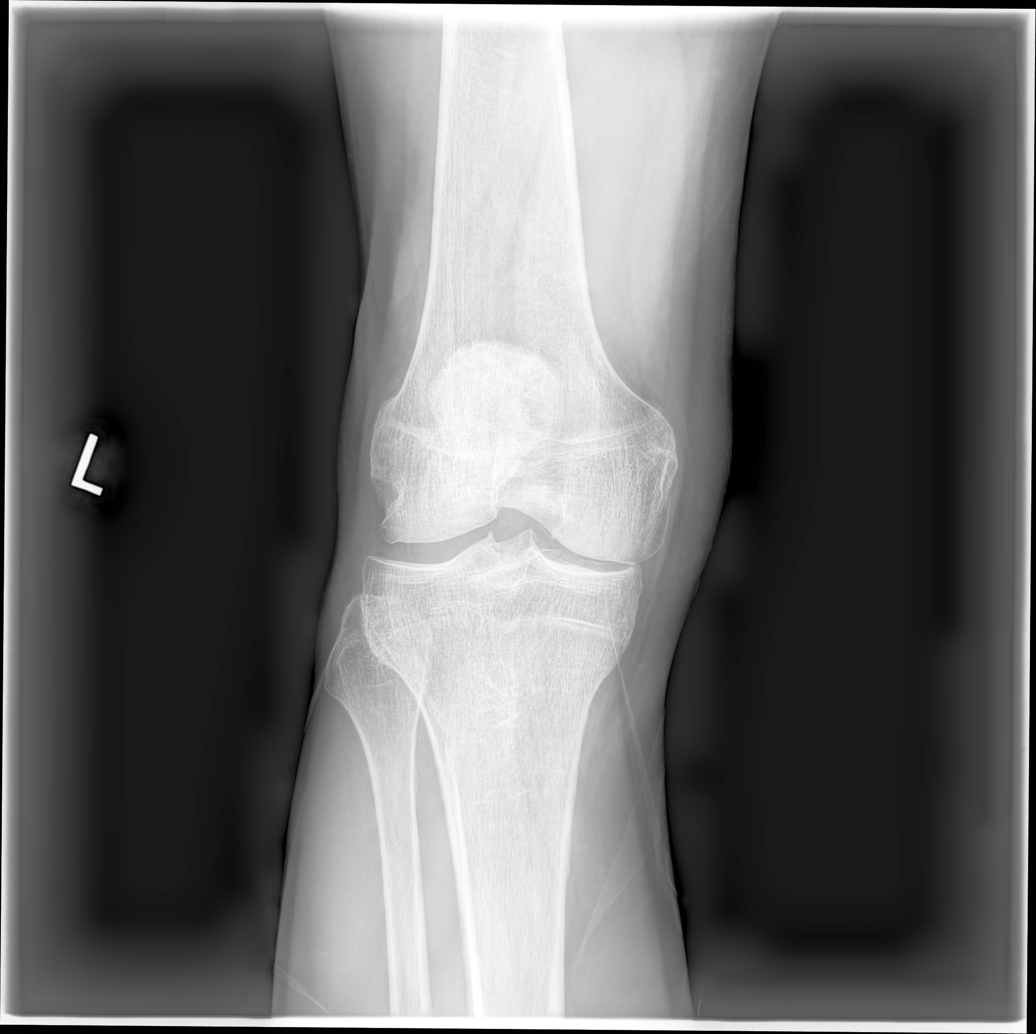

[knee lat]
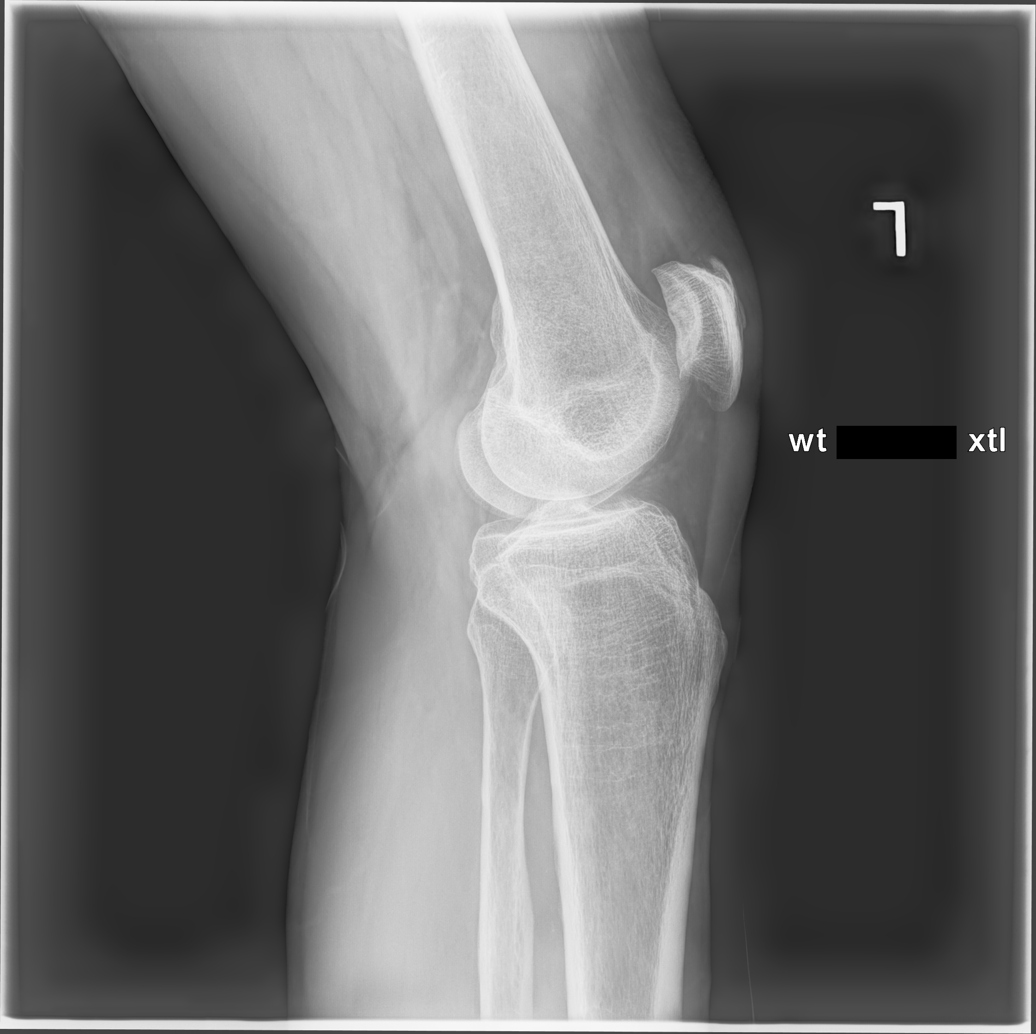

[patella (sunrise)]
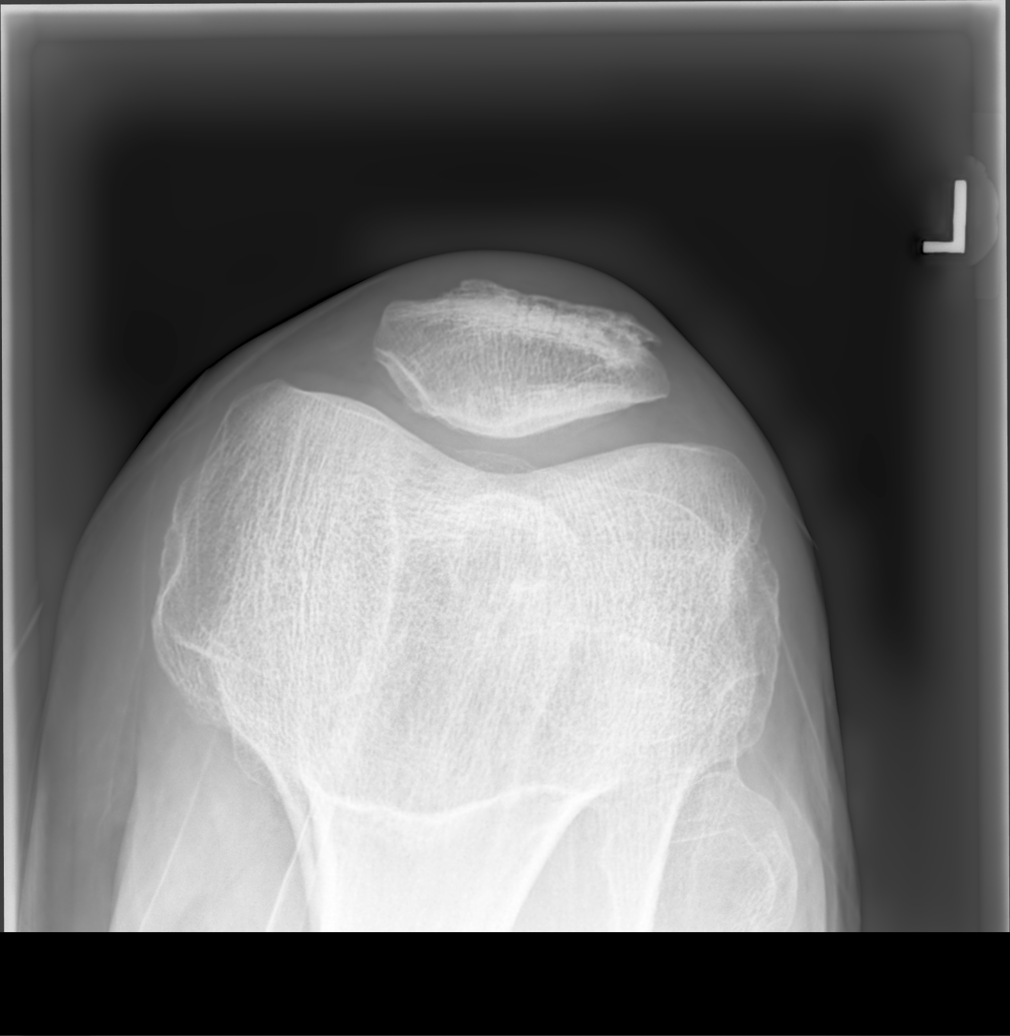

[knee ap (2 of 2)]
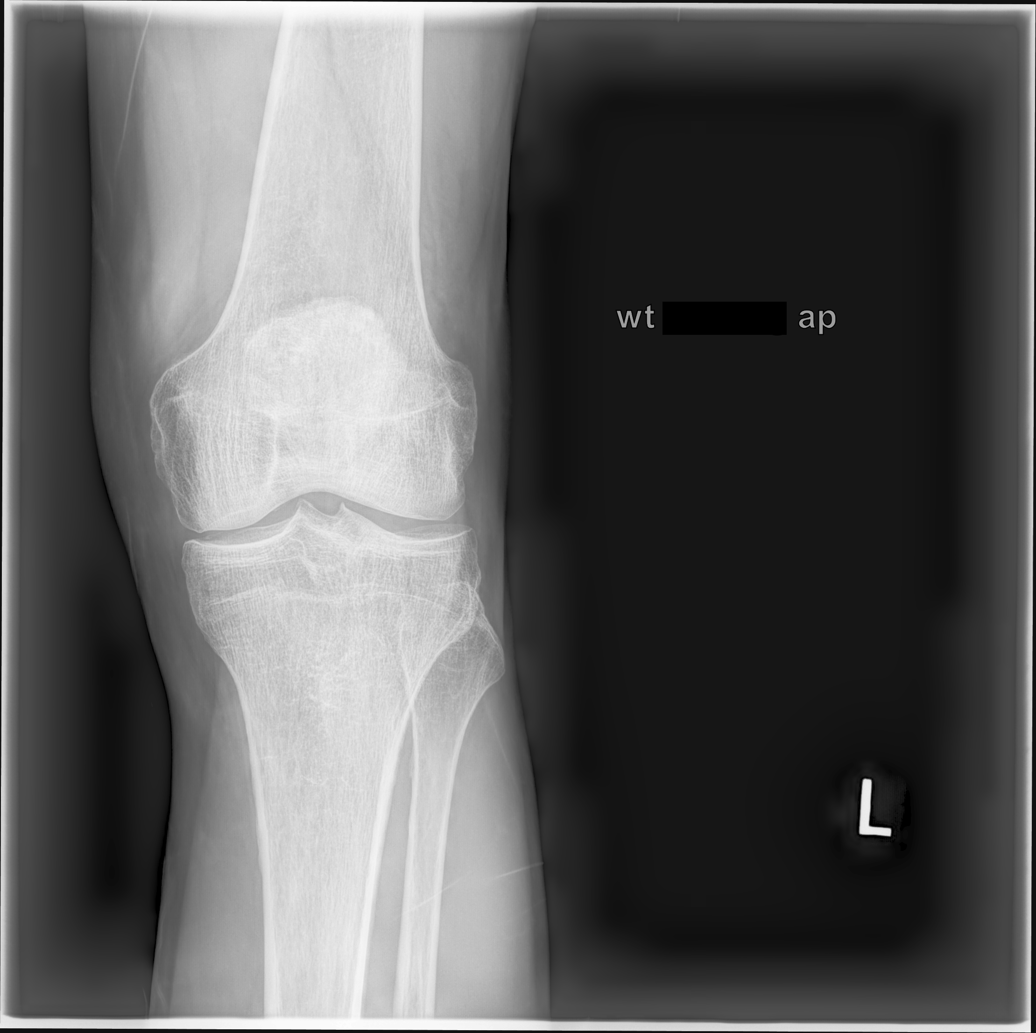

[5 of 5 positions shown; findings below may reference images not displayed]

FINDINGS: Joint space narrowing and spurring in all 3 compartments. No joint
effusion. No acute bony abnormality. Specifically, no fracture,
subluxation, or dislocation.
IMPRESSION: Mild tricompartment degenerative changes. No acute bony abnormality.

## 2020-01-08 ENCOUNTER — Encounter: Payer: Self-pay | Admitting: Gastroenterology

## 2020-01-08 ENCOUNTER — Ambulatory Visit: Payer: Medicare HMO

## 2020-01-08 ENCOUNTER — Ambulatory Visit (AMBULATORY_SURGERY_CENTER): Payer: Medicare HMO

## 2020-01-08 ENCOUNTER — Other Ambulatory Visit: Payer: Self-pay

## 2020-01-08 VITALS — Ht 75.0 in | Wt 196.0 lb

## 2020-01-08 DIAGNOSIS — R1310 Dysphagia, unspecified: Secondary | ICD-10-CM

## 2020-01-08 DIAGNOSIS — Z01818 Encounter for other preprocedural examination: Secondary | ICD-10-CM

## 2020-01-08 NOTE — Progress Notes (Signed)
No allergies to soy or egg Pt is not on blood thinners or diet pills Denies issues with sedation/intubation Denies atrial flutter/fib Denies constipation   Emmi instructions given to pt  Pt is aware of Covid safety and care partner requirements.    Takes Vitamins that wife gives but he can't recall the names.

## 2020-01-09 ENCOUNTER — Other Ambulatory Visit: Payer: Self-pay | Admitting: Gastroenterology

## 2020-01-09 DIAGNOSIS — Z1159 Encounter for screening for other viral diseases: Secondary | ICD-10-CM | POA: Diagnosis not present

## 2020-01-09 LAB — SARS CORONAVIRUS 2 (TAT 6-24 HRS): SARS Coronavirus 2: NEGATIVE

## 2020-01-13 ENCOUNTER — Ambulatory Visit (AMBULATORY_SURGERY_CENTER): Payer: Medicare HMO | Admitting: Gastroenterology

## 2020-01-13 ENCOUNTER — Other Ambulatory Visit: Payer: Self-pay

## 2020-01-13 ENCOUNTER — Encounter: Payer: Self-pay | Admitting: Gastroenterology

## 2020-01-13 VITALS — BP 115/68 | HR 69 | Temp 97.1°F | Resp 15 | Ht 75.0 in | Wt 196.0 lb

## 2020-01-13 DIAGNOSIS — K222 Esophageal obstruction: Secondary | ICD-10-CM

## 2020-01-13 DIAGNOSIS — K2100 Gastro-esophageal reflux disease with esophagitis, without bleeding: Secondary | ICD-10-CM

## 2020-01-13 DIAGNOSIS — K219 Gastro-esophageal reflux disease without esophagitis: Secondary | ICD-10-CM | POA: Diagnosis not present

## 2020-01-13 DIAGNOSIS — R1310 Dysphagia, unspecified: Secondary | ICD-10-CM | POA: Diagnosis not present

## 2020-01-13 DIAGNOSIS — E079 Disorder of thyroid, unspecified: Secondary | ICD-10-CM | POA: Diagnosis not present

## 2020-01-13 HISTORY — PX: UPPER GASTROINTESTINAL ENDOSCOPY: SHX188

## 2020-01-13 MED ORDER — SODIUM CHLORIDE 0.9 % IV SOLN: 500.0000 mL | Freq: Once | INTRAVENOUS | Status: AC

## 2020-01-13 NOTE — Progress Notes (Addendum)
[place]  Pt's states no medical or surgical changes since previsit or office visit.   Per pt he is taking the pantoprazole 40 mg daily instead of BID.  Reason for the change per reported he was having a slight headache and fever blister around his mouth.  Once he cut back, he reported his sx improved.  I asked pt to talk with Dr. Melanee Left before anesthesia.  I also reported these sx to Dr. Bryan Lemma.  maw

## 2020-01-13 NOTE — Patient Instructions (Signed)
Information on esophagitis and post dilation diet given to you today..  Eat a soft diet today.  You may resume your regular diet tomorrow.  Resume pantoprazole 40 mg daily for another 6 weeks.    Return to GI office in 3 months.  YOU HAD AN ENDOSCOPIC PROCEDURE TODAY AT [facility]:   Refer to the procedure report that was given to you for any specific questions about what was found during the examination.  If the procedure report does not answer your questions, please call your gastroenterologist to clarify.  If you requested that your care partner not be given the details of your procedure findings, then the procedure report has been included in a sealed envelope for you to review at your convenience later.  YOU SHOULD EXPECT: Some feelings of bloating in the abdomen. Passage of more gas than usual.  Walking can help get rid of the air that was put into your GI tract during the procedure and reduce the bloating. If you had a lower endoscopy (such as a colonoscopy or flexible sigmoidoscopy) you may notice spotting of blood in your stool or on the toilet paper. If you underwent a bowel prep for your procedure, you may not have a normal bowel movement for a few days.  Please Note:  You might notice some irritation and congestion in your nose or some drainage.  This is from the oxygen used during your procedure.  There is no need for concern and it should clear up in a day or so.  SYMPTOMS TO REPORT IMMEDIATELY:   Following upper endoscopy (EGD)  Vomiting of blood or coffee ground material  New chest pain or pain under the shoulder blades  Painful or persistently difficult swallowing  New shortness of breath  Fever of 100F or higher  Black, tarry-looking stools  For urgent or emergent issues, a gastroenterologist can be reached at any hour by calling [phone]. Do not use MyChart messaging for urgent concerns.    DIET:  We do recommend a small meal at first, but then  you may proceed to your regular diet.  Drink plenty of fluids but you should avoid alcoholic beverages for 24 hours.  ACTIVITY:  You should plan to take it easy for the rest of today and you should NOT DRIVE or use heavy machinery until tomorrow (because of the sedation medicines used during the test).    FOLLOW UP: Our staff will call the number listed on your records 48-72 hours following your procedure to check on you and address any questions or concerns that you may have regarding the information given to you following your procedure. If we do not reach you, we will leave a message.  We will attempt to reach you two times.  During this call, we will ask if you have developed any symptoms of COVID 19. If you develop any symptoms (ie: fever, flu-like symptoms, shortness of breath, cough etc.) before then, please call [phone].  If you test positive for Covid 19 in the 2 weeks post procedure, please call and report this information to [place].    If any biopsies were taken you will be contacted by phone or by letter within the next 1-3 weeks.  Please call us at [phone] if you have not heard about the biopsies in 3 weeks.    SIGNATURES/CONFIDENTIALITY: You and/or your care partner have signed paperwork which will be entered into your electronic medical record.  These signatures attest to the fact  that that the information above on your After Visit Summary has been reviewed and is understood.  Full responsibility of the confidentiality of this discharge information lies with you and/or your care-partner.

## 2020-01-13 NOTE — Progress Notes (Signed)
Report given to PACU, vss 

## 2020-01-13 NOTE — Progress Notes (Signed)
0900 Robinul 0.1 mg IV given due large amount of secretions upon assessment.  MD made aware, vss 

## 2020-01-13 NOTE — Progress Notes (Signed)
Called to room to assist during endoscopic procedure.  Patient ID and intended procedure confirmed with present staff. Received instructions for my participation in the procedure from the performing physician.  

## 2020-01-13 NOTE — Op Note (Signed)
[facility] Patient Name: Nathan [name] Procedure Date: 01/13/2020 8:53 AM MRN: [id number] Endoscopist: Gerrit Heck , MD Age: 69 Referring MD:  Date of Birth: 02/10/1951 Gender: Male Account #: [id number] Procedure:                Upper GI endoscopy Indications:              Dysphagia, Esophageal reflux, Follow-up of                            esophageal stricture, For therapy of esophageal                            stricture                           EGD on 11/21/19 with distal esophageal stricture,                            dilated with 14.5 mm TTS balloon then fractured                            with forceps with good clinical response. Was also                            treated with high dose PPI to promote mucosal                            healing and treatment of underlying reflux, but                            developed headache. Reduced to daily PPI with                            resolution of headaches. He presents today for                            re-evaluation and repeat dilation. Medicines:                Monitored Anesthesia Care Procedure:                Pre-Anesthesia Assessment:                           - Prior to the procedure, a History and Physical                            was performed, and patient medications and                            allergies were reviewed. The patient's tolerance of                            previous anesthesia was also reviewed. The risks  and benefits of the procedure and the sedation                            options and risks were discussed with the patient.                            All questions were answered, and informed consent                            was obtained. Prior Anticoagulants: The patient has                            taken no previous anticoagulant or antiplatelet                            agents. ASA Grade Assessment: II - A patient with                             mild systemic disease. After reviewing the risks                            and benefits, the patient was deemed in                            satisfactory condition to undergo the procedure.                           After obtaining informed consent, the endoscope was                            passed under direct vision. Throughout the                            procedure, the patient's blood pressure, pulse, and                            oxygen saturations were monitored continuously. The                            Endoscope was introduced through the mouth, and                            advanced to the second part of duodenum. The upper                            GI endoscopy was accomplished without difficulty.                            The patient tolerated the procedure well. Scope In: Scope Out: Findings:                 One benign-appearing, intrinsic mild stenosis was  found 45 cm from the incisors. This stenosis                            measured 1 cm (in length). The stenosis was                            traversed. A TTS dilator was passed through the                            scope. Dilation with an 18-19-20 mm balloon dilator                            was performed to 19 mm. The dilation site was                            examined and showed mild mucosal disruption x2 and                            moderate improvement in luminal narrowing,                            consistent with successful dilation. Estimated                            blood loss was minimal.                           The upper third of the esophagus and middle third                            of the esophagus were normal.                           The entire examined stomach was normal.                           The examined duodenum was normal. Complications:            No immediate complications. Estimated Blood Loss:     Estimated blood loss was  minimal. Impression:               - Benign-appearing esophageal stenosis. Dilated                            with 19 mm TTS balloon.                           - Normal upper third of esophagus and middle third                            of esophagus.                           - Normal stomach.                           -  Normal examined duodenum.                           - No specimens collected. Recommendation:           - Patient has a contact number available for                            emergencies. The signs and symptoms of potential                            delayed complications were discussed with the                            patient. Return to normal activities tomorrow.                            Written discharge instructions were provided to the                            patient.                           - Soft diet today.                           - Continue present medications.                           - Resume pantoprazole 40 mg daily for another 6                            weeks, and if reflux symptoms still well                            controlled, can titrate to the lowest effective                            dose for reflux management.                           - Repeat upper endoscopy PRN for retreatment.                           - Return to GI office in 3 months. Gerrit Heck, MD 01/13/2020 9:21:06 AM

## 2020-01-15 ENCOUNTER — Telehealth: Payer: Self-pay

## 2020-01-15 NOTE — Telephone Encounter (Signed)
°  Follow up Call-  Call back number 01/13/2020 11/21/2019  Post procedure Call Back phone  # [phone] cell [phone]  Permission to leave phone message Yes Yes     Patient questions:  Do you have a fever, pain , or abdominal swelling? No. Pain Score  0 *  Have you tolerated food without any problems? Yes.    Have you been able to return to your normal activities? Yes.    Do you have any questions about your discharge instructions: Diet   No. Medications  No. Follow up visit  No.  Do you have questions or concerns about your Care? No.  Actions: * If pain score is 4 or above: No action needed, pain <4.  1. Have you developed a fever since your procedure?  no  2.   Have you had an respiratory symptoms (SOB or cough) since your procedure? no  3.   Have you tested positive for COVID 19 since your procedure no  4.   Have you had any family members/close contacts diagnosed with the COVID 19 since your procedure?  no   If yes to any of these questions please route to Joylene Sawyer, RN and Joella Prince, RN

## 2020-02-02 ENCOUNTER — Other Ambulatory Visit: Payer: Self-pay | Admitting: Family Medicine

## 2020-02-02 DIAGNOSIS — E039 Hypothyroidism, unspecified: Secondary | ICD-10-CM

## 2020-02-06 ENCOUNTER — Telehealth: Payer: Self-pay

## 2020-02-06 DIAGNOSIS — E039 Hypothyroidism, unspecified: Secondary | ICD-10-CM

## 2020-02-06 MED ORDER — LEVOTHYROXINE SODIUM 75 MCG PO TABS: 75.0000 ug | ORAL_TABLET | Freq: Every day | ORAL | 0 refills | Status: DC

## 2020-02-06 NOTE — Telephone Encounter (Signed)
Last OV 09/19/19 Last fill 10/10/19  #30/0

## 2020-02-09 ENCOUNTER — Other Ambulatory Visit: Payer: Self-pay | Admitting: Family Medicine

## 2020-02-09 DIAGNOSIS — E039 Hypothyroidism, unspecified: Secondary | ICD-10-CM

## 2020-03-09 ENCOUNTER — Other Ambulatory Visit: Payer: Self-pay | Admitting: Family Medicine

## 2020-03-09 DIAGNOSIS — E039 Hypothyroidism, unspecified: Secondary | ICD-10-CM

## 2020-03-10 NOTE — Telephone Encounter (Signed)
Last OV 12/16/19 Last fill 02/06/20  #30/0

## 2020-03-15 ENCOUNTER — Ambulatory Visit: Payer: Medicare HMO

## 2020-04-08 ENCOUNTER — Other Ambulatory Visit: Payer: Self-pay | Admitting: Nurse Practitioner

## 2020-04-08 DIAGNOSIS — E039 Hypothyroidism, unspecified: Secondary | ICD-10-CM

## 2020-04-12 NOTE — Telephone Encounter (Signed)
Patient is calling to check the status of his refill. He asked if he can get a 90 day supply instead of 30 days. Please call patient at [phone] to let him know that it has been sent in. He said he's completely out.

## 2020-04-13 NOTE — Telephone Encounter (Signed)
Scheduled

## 2020-04-13 NOTE — Telephone Encounter (Signed)
Pt called to follow up on this because he is out, he requested a call back when it is sent in

## 2020-04-14 NOTE — Telephone Encounter (Signed)
A user error has taken place: erroneous encounter.

## 2020-04-28 ENCOUNTER — Encounter: Payer: Self-pay | Admitting: Family Medicine

## 2020-04-28 ENCOUNTER — Telehealth (INDEPENDENT_AMBULATORY_CARE_PROVIDER_SITE_OTHER): Payer: Medicare HMO | Admitting: Family Medicine

## 2020-04-28 VITALS — Ht 75.0 in | Wt 195.0 lb

## 2020-04-28 DIAGNOSIS — E039 Hypothyroidism, unspecified: Secondary | ICD-10-CM

## 2020-04-28 MED ORDER — LEVOTHYROXINE SODIUM 75 MCG PO TABS: 75.0000 ug | ORAL_TABLET | Freq: Every day | ORAL | 3 refills | Status: DC

## 2020-04-28 NOTE — Progress Notes (Signed)
Virtual Visit via Video Note  I connected with [name] on 04/28/20 at  3:00 PM EST by a video enabled telemedicine application and verified that I am speaking with the correct person using two identifiers. Location patient: home Location provider:  home office Persons participating in the virtual visit: patient, provider  I discussed the limitations of evaluation and management by telemedicine and the availability of in person appointments. The patient expressed understanding and agreed to proceed.  Chief Complaint  Patient presents with  . Follow-up    6 month f/u meds.  No concerns.  Declines both flu and covid    HPI: Nathan [name] is a 70 y.o. male seen today for routine 6 mo f/u on hypothyroidism. Pt is taking levothyroxine 28mcg daily and needs a refill. He has no acute issues or concerns.  No changes in his health. No increased fatigue, constipation, depression. He is due for annual CPE in 09/2020.  Lab Results  Component Value Date   TSH 2.800 09/19/2019   T4TOTAL 7.5 09/26/2018    Past Medical History:  Diagnosis Date  . Chronic pain of both knees 03/18/2018  . Colon polyps   . GERD (gastroesophageal reflux disease)   . Hepatitis C    s/p tx with Harvoni  . IBS (irritable bowel syndrome)   . Skin cancer   . Thyroid disease     Past Surgical History:  Procedure Laterality Date  . BUNIONECTOMY Right   . COLONOSCOPY  05/02/2016   [facility]  . ESOPHAGOGASTRODUODENOSCOPY  06/16/2015   [facility]  . LIPOMA EXCISION    . TONSILLECTOMY    . TRANSURETHRAL RESECTION OF PROSTATE  2018  . UPPER GASTROINTESTINAL ENDOSCOPY  01/13/2020   11/21/19, stricture, dilation    Family History  Problem Relation Age of Onset  . Heart disease Mother   . Heart disease Father   . Hearing loss Father   . Hypertension Father   . Heart disease Sister   . Hypertension Sister   . Heart disease Brother   . Hypertension Brother   . Heart disease  Maternal Grandfather   . Heart disease Paternal Grandfather   . Colon cancer Neg Hx   . Esophageal cancer Neg Hx   . Rectal cancer Neg Hx   . Stomach cancer Neg Hx   . Colon polyps Neg Hx     Social History   Tobacco Use  . Smoking status: Never Smoker  . Smokeless tobacco: Never Used  Vaping Use  . Vaping Use: Never used  Substance Use Topics  . Alcohol use: Not Currently  . Drug use: Never     Current Outpatient Medications:  .  Cyanocobalamin (VITAMIN B 12 PO), Take by mouth., Disp: , Rfl:  .  levothyroxine (EUTHYROX) 75 MCG tablet, Take 1 tablet (75 mcg total) by mouth daily. No additional refills without appt with pcp, Disp: 90 tablet, Rfl: 0 .  Omega-3 Fatty Acids (FISH OIL) 1000 MG CAPS, Take by mouth as needed (Takes when he can remember it).  (Patient not taking: Reported on 04/28/2020), Disp: , Rfl:  .  pantoprazole (PROTONIX) 40 MG tablet, Take 1 tablet (40 mg total) by mouth 2 (two) times daily. (Patient not taking: Reported on 04/28/2020), Disp: 60 tablet, Rfl: 1  Current Facility-Administered Medications:  .  0.9 %  sodium chloride infusion, 500 mL, Intravenous, Once, Shirline Kendle, Vito V, DO  Allergies  Allergen Reactions  . Bactrim [Sulfamethoxazole-Trimethoprim] Other (See Comments)  Fever       ROS: See pertinent positives and negatives per HPI.   EXAM:  VITALS per patient if applicable: Ht 6\' 3"  (1.905 m)   Wt 195 lb (88.5 kg) Comment: pt reported  BMI 24.37 kg/m    GENERAL: alert, oriented, appears well and in no acute distress  NECK: normal movements of the head and neck  LUNGS: on inspection no signs of respiratory distress, breathing rate appears normal, no obvious gross SOB, gasping or wheezing, no conversational dyspnea  CV: no obvious cyanosis  PSYCH/NEURO: pleasant and cooperative, no obvious depression or anxiety, speech and thought processing grossly intact   ASSESSMENT AND PLAN:  1. Acquired hypothyroidism - stable and  normal TFTs in 09/2019 - plan for repeat labs in 09/2020 Refill: - levothyroxine (EUTHYROX) 75 MCG tablet; Take 1 tablet (75 mcg total) by mouth daily.  Dispense: 90 tablet; Refill: 3 - will plan on once per year f/u and PRN with appt in mid summer each year if possible   I discussed the assessment and treatment plan with the patient. The patient was provided an opportunity to ask questions and all were answered. The patient agreed with the plan and demonstrated an understanding of the instructions.   The patient was advised to call back or seek an in-person evaluation if the symptoms worsen or if the condition fails to improve as anticipated.   [name], DO

## 2020-06-28 ENCOUNTER — Other Ambulatory Visit: Payer: Self-pay | Admitting: Nurse Practitioner

## 2020-06-28 DIAGNOSIS — E039 Hypothyroidism, unspecified: Secondary | ICD-10-CM

## 2020-06-28 NOTE — Telephone Encounter (Signed)
Last VV 04/28/20 Last fill 04/28/20  #90/3

## 2020-07-20 ENCOUNTER — Ambulatory Visit (INDEPENDENT_AMBULATORY_CARE_PROVIDER_SITE_OTHER): Payer: Medicare HMO | Admitting: Gastroenterology

## 2020-07-20 ENCOUNTER — Other Ambulatory Visit: Payer: Self-pay

## 2020-07-20 ENCOUNTER — Encounter: Payer: Self-pay | Admitting: Gastroenterology

## 2020-07-20 VITALS — BP 116/78 | HR 85 | Ht 75.0 in | Wt 199.0 lb

## 2020-07-20 DIAGNOSIS — Z8719 Personal history of other diseases of the digestive system: Secondary | ICD-10-CM | POA: Diagnosis not present

## 2020-07-20 DIAGNOSIS — M7918 Myalgia, other site: Secondary | ICD-10-CM | POA: Diagnosis not present

## 2020-07-20 DIAGNOSIS — R140 Abdominal distension (gaseous): Secondary | ICD-10-CM

## 2020-07-20 DIAGNOSIS — R12 Heartburn: Secondary | ICD-10-CM | POA: Diagnosis not present

## 2020-07-20 MED ORDER — SUCRALFATE 1 G PO TABS: 1.0000 g | ORAL_TABLET | Freq: Two times a day (BID) | ORAL | 0 refills | Status: AC

## 2020-07-20 NOTE — Progress Notes (Signed)
P  Chief Complaint:    Abdominal bloating  GI History: 70 y.o. male with history of hypothyroidism, HCV) treatment with SVR in 2015 at [facility]), initially seen in GI clinic on 11/17/2019 for evaluation of dysphagia and heartburn.  Underwent EGD x2 with esophageal dilation and treated with high-dose PPI as outlined below.  Endoscopic history: -EGD (2017, [facility]): Unsure of results, but esophageal dilation performed with patient -Colonoscopy (04/2016, [facility]): Normal per patient, repeat 10 years - EGD (11/2019, Dr. Bryan Lemma): Distal esophageal stricture dilated with 14.5 mm TTS balloon and fractured with forceps.  Started Protonix 40 mg BID - EGD (01/2020, Dr. Bryan Lemma): Distal esophageal stricture dilated with 19 mm TTS balloon.  Continue Protonix at 40 mg/day   HPI:     Patient is a 70 y.o. male presenting to the Gastroenterology Clinic for follow-up. The dysphagia has resolved with balloon dilation x2 in 2021 as above.  Main issue today is abdominal bloating and occasional discomfort. Rare HB. No longer taking PPI. Bloating seems to have started after starting Protonix and worse in the last 30 days or so. Has not tried any dietary mods or new meds, supplements, etc. No change in bowel habits, hematochezia, melena.  Does have increased stress lately as well.   Separately, recent onset pain in his left inguinal area.  Pain worse with leg raise and forward flexion.  Symptoms occur intermittently and not present currently.  No associated lymphadenopathy, rash, swelling.  Review of systems:     No chest pain, no SOB, no fevers, no urinary sx   Past Medical History:  Diagnosis Date  . Chronic pain of both knees 03/18/2018  . Colon polyps   . GERD (gastroesophageal reflux disease)   . Hepatitis C    s/p tx with Harvoni  . IBS (irritable bowel syndrome)   . Skin cancer   . Thyroid disease     Patient's surgical history, family medical  history, social history, medications and allergies were all reviewed in Epic    Current Outpatient Medications  Medication Sig Dispense Refill  . Cyanocobalamin (VITAMIN B 12 PO) Take by mouth.    . EUTHYROX 75 MCG tablet TAKE 1 TABLET BY MOUTH ONCE DAILY . APPOINTMENT REQUIRED FOR FUTURE REFILLS 90 tablet 0   Current Facility-Administered Medications  Medication Dose Route Frequency Provider Last Rate Last Admin  . 0.9 %  sodium chloride infusion  500 mL Intravenous Once Iniko Robles V, DO        Physical Exam:     BP 116/78   Pulse 85   Ht 6\' 3"  (1.905 m)   Wt 199 lb (90.3 kg)   BMI 24.87 kg/m   GENERAL:  Pleasant male in NAD PSYCH: : Cooperative, normal affect EENT:  conjunctiva pink, mucous membranes moist, neck supple without masses ABDOMEN: Minimal discomfort.  Lower abdomen without rebound or guarding.  No peritoneal signs.  Nondistended, soft. No obvious masses, no hepatomegaly,  normal bowel sounds SKIN:  turgor, no lesions seen Musculoskeletal:  Normal muscle tone, normal strength.  Described pain not reproducible on exam today. NEURO: Alert and oriented x 3, no focal neurologic deficits   IMPRESSION and PLAN:    1) History of stricture - Dysphagia completely resolved after EGD with TTS balloon dilation  2) Abdominal bloating - Symptoms seem to coincide with PPI, but also worse with stress.  Query change in gut microbiota vs irritable bowel. - Trial course of probiotics - Depending  on response to therapy, also discussed possible SIBO breath testing vs rifaximin or trial of antispasmodics, Gas-X, Beano, etc. in the future - Patient very worried that this represents an ulcer and will also trial course of Carafate x4 weeks  3) Heartburn - Largely resolved with very rare breakthrough episodes  4) MSK pain - Recent onset intermittent pain at left hip flexor area.  No swelling or lymphadenopathy.  No pain present today.  Worse with leg raise forward flexion.   Recommended conservative management for suspected MSK strain and can follow-up with PCM if symptoms persist  I spent 30 minutes of time, including in depth chart review, independent review of results as outlined above, communicating results with the patient directly, face-to-face time with the patient, coordinating care, and ordering studies and medications as appropriate, and documentation.           Lavena Bullion ,DO, FACG 07/20/2020, 9:54 AM

## 2020-07-20 NOTE — Patient Instructions (Signed)
If you are age 72 or older, your body mass index should be between 23-30. Your Body mass index is 24.87 kg/m. If this is out of the aforementioned range listed, please consider follow up with your Primary Care Provider.  If you are age 61 or younger, your body mass index should be between 19-25. Your Body mass index is 24.87 kg/m. If this is out of the aformentioned range listed, please consider follow up with your Primary Care Provider.   We have sent the following medications to your pharmacy for you to pick up at your convenience: Carafate 1g 2 times daily for 4 weeks  Please purchase the following medications over the counter and take as directed: Align 1 tablet daily for 4 to 6 weeks.  Please call with any questions or concerns.  It was a pleasure to see you today!  Vito Cirigliano, D.O.

## 2020-08-31 DIAGNOSIS — Z0100 Encounter for examination of eyes and vision without abnormal findings: Secondary | ICD-10-CM | POA: Diagnosis not present

## 2020-08-31 DIAGNOSIS — H524 Presbyopia: Secondary | ICD-10-CM | POA: Diagnosis not present

## 2020-09-22 ENCOUNTER — Other Ambulatory Visit: Payer: Self-pay

## 2020-09-22 ENCOUNTER — Ambulatory Visit (INDEPENDENT_AMBULATORY_CARE_PROVIDER_SITE_OTHER): Payer: Medicare HMO | Admitting: Family Medicine

## 2020-09-22 ENCOUNTER — Encounter: Payer: Self-pay | Admitting: Family Medicine

## 2020-09-22 VITALS — BP 112/70 | HR 75 | Temp 97.7°F | Ht 74.0 in | Wt 195.6 lb

## 2020-09-22 DIAGNOSIS — Z13220 Encounter for screening for lipoid disorders: Secondary | ICD-10-CM

## 2020-09-22 DIAGNOSIS — E039 Hypothyroidism, unspecified: Secondary | ICD-10-CM

## 2020-09-22 DIAGNOSIS — Z125 Encounter for screening for malignant neoplasm of prostate: Secondary | ICD-10-CM

## 2020-09-22 DIAGNOSIS — Z2821 Immunization not carried out because of patient refusal: Secondary | ICD-10-CM | POA: Diagnosis not present

## 2020-09-22 DIAGNOSIS — R7301 Impaired fasting glucose: Secondary | ICD-10-CM | POA: Diagnosis not present

## 2020-09-22 DIAGNOSIS — Z28310 Unvaccinated for covid-19: Secondary | ICD-10-CM | POA: Diagnosis not present

## 2020-09-22 LAB — T4, FREE: Free T4: 1.01 ng/dL (ref 0.60–1.60)

## 2020-09-22 LAB — COMPREHENSIVE METABOLIC PANEL
ALT: 28 U/L (ref 0–53)
AST: 20 U/L (ref 0–37)
Albumin: 4.5 g/dL (ref 3.5–5.2)
Alkaline Phosphatase: 50 U/L (ref 39–117)
BUN: 12 mg/dL (ref 6–23)
CO2: 28 mEq/L (ref 19–32)
Calcium: 9.1 mg/dL (ref 8.4–10.5)
Chloride: 104 mEq/L (ref 96–112)
Creatinine, Ser: 1.31 mg/dL (ref 0.40–1.50)
GFR: 54.56 mL/min — ABNORMAL LOW (ref >60.00)
Glucose, Bld: 81 mg/dL (ref 70–99)
Potassium: 4.2 mEq/L (ref 3.5–5.1)
Sodium: 139 mEq/L (ref 135–145)
Total Bilirubin: 0.9 mg/dL (ref 0.2–1.2)
Total Protein: 6.4 g/dL (ref 6.0–8.3)

## 2020-09-22 LAB — TSH: TSH: 3.47 u[IU]/mL (ref 0.35–5.50)

## 2020-09-22 LAB — LIPID PANEL
Cholesterol: 183 mg/dL (ref 0–200)
HDL: 37.3 mg/dL — ABNORMAL LOW (ref >39.00)
LDL Cholesterol: 114 mg/dL — ABNORMAL HIGH (ref 0–99)
NonHDL: 145.39
Total CHOL/HDL Ratio: 5
Triglycerides: 159 mg/dL — ABNORMAL HIGH (ref 0.0–149.0)
VLDL: 31.8 mg/dL (ref 0.0–40.0)

## 2020-09-22 LAB — PSA: PSA: 1.24 ng/mL (ref 0.10–4.00)

## 2020-09-22 LAB — HEMOGLOBIN A1C: Hgb A1c MFr Bld: 5.1 % (ref 4.6–6.5)

## 2020-09-22 MED ORDER — LEVOTHYROXINE SODIUM 75 MCG PO TABS: ORAL_TABLET | ORAL | 3 refills | Status: AC

## 2020-09-22 NOTE — Patient Instructions (Addendum)
  [facility] [place], [place], [place] [address] [phone] [email or web address]  [facility] [address] [place], [place] [address] [phone] [email or web address]

## 2020-09-22 NOTE — Progress Notes (Signed)
Nathan [name] is a 70 y.o. male  Chief Complaint  Patient presents with   Annual Exam    CPE. Pt request refill thyroid medication.    HPI: Nathan [name] is a 70 y.o. male patient seen today for annual exam and f/u on chronic medical issues including hypothyroidism, IFG.  Pt is taking Euthyrox 67mcg daily and needs a refill.  Last Colonoscopy: 2018 - due in 2028  Dental: due Vision: wears glasses and UTD on exam  Med refills needed today: yes thyroid med - see orders  Pt has not had covid vaccines, declines pneumonia and shingles vaccines.   Past Medical History:  Diagnosis Date   Chronic pain of both knees 03/18/2018   Colon polyps    GERD (gastroesophageal reflux disease)    Hepatitis C    s/p tx with Harvoni   IBS (irritable bowel syndrome)    Skin cancer    Thyroid disease     Past Surgical History:  Procedure Laterality Date   BUNIONECTOMY Right    COLONOSCOPY  05/02/2016   [facility]   ESOPHAGOGASTRODUODENOSCOPY  06/16/2015   [facility]   LIPOMA EXCISION     TONSILLECTOMY     TRANSURETHRAL RESECTION OF PROSTATE  2018   UPPER GASTROINTESTINAL ENDOSCOPY  01/13/2020   11/21/19, stricture, dilation    Social History   Socioeconomic History   Marital status: Married    Spouse name: Not on file   Number of children: Not on file   Years of education: Not on file   Highest education level: Not on file  Occupational History   Not on file  Tobacco Use   Smoking status: Never   Smokeless tobacco: Never  Vaping Use   Vaping Use: Never used  Substance and Sexual Activity   Alcohol use: Not Currently   Drug use: Never   Sexual activity: Yes  Other Topics Concern   Not on file  Social History Narrative   Not on file   Social Determinants of Health   Financial Resource Strain: Low Risk    Difficulty of Paying Living Expenses: Not hard at all  Food Insecurity: No Food Insecurity   Worried About Charity fundraiser in the  Last Year: Never true   Ran Out of Food in the Last Year: Never true  Transportation Needs: No Transportation Needs   Lack of Transportation (Medical): No   Lack of Transportation (Non-Medical): No  Physical Activity: Sufficiently Active   Days of Exercise per Week: 4 days   Minutes of Exercise per Session: 40 min  Stress: No Stress Concern Present   Feeling of Stress : Not at all  Social Connections: Socially Integrated   Frequency of Communication with Friends and Family: More than three times a week   Frequency of Social Gatherings with Friends and Family: More than three times a week   Attends Religious Services: 1 to 4 times per year   Active Member of [employer] or Organizations: Yes   Attends Archivist Meetings: 1 to 4 times per year   Marital Status: Married  Human resources officer Violence: Not At Risk   Fear of Current or Ex-Partner: No   Emotionally Abused: No   Physically Abused: No   Sexually Abused: No    Family History  Problem Relation Age of Onset   Heart disease Mother    Heart disease Father    Hearing loss Father    Hypertension Father  Heart disease Sister    Hypertension Sister    Heart disease Brother    Hypertension Brother    Heart disease Maternal Grandfather    Heart disease Paternal Grandfather    Colon cancer Neg Hx    Esophageal cancer Neg Hx    Rectal cancer Neg Hx    Stomach cancer Neg Hx    Colon polyps Neg Hx      Immunization History  Administered Date(s) Administered   Hep A / Hep B 09/25/2011    Outpatient Encounter Medications as of 09/22/2020  Medication Sig   Cyanocobalamin (VITAMIN B 12 PO) Take by mouth.   EUTHYROX 75 MCG tablet TAKE 1 TABLET BY MOUTH ONCE DAILY . APPOINTMENT REQUIRED FOR FUTURE REFILLS   sucralfate (CARAFATE) 1 g tablet Take 1 tablet (1 g total) by mouth 2 (two) times daily for 28 days.   Facility-Administered Encounter Medications as of 09/22/2020  Medication   0.9 %  sodium chloride infusion      ROS: Gen: no fever, chills  Skin: no rash, itching ENT: no ear pain, ear drainage, nasal congestion, rhinorrhea, sinus pressure, sore throat Eyes: no blurry vision, double vision Resp: no cough, wheeze,SOB CV: no CP, palpitations, LE edema,  GI: no heartburn, n/v/d/c, abd pain GU: no dysuria, urgency, frequency, hematuria; no testicular swelling or masses MSK: no joint pain, myalgias, back pain Neuro: no dizziness, headache, weakness, vertigo Psych: no depression, anxiety, insomnia   Allergies  Allergen Reactions   Bactrim [Sulfamethoxazole-Trimethoprim] Other (See Comments)    Fever     BP 112/70 (BP Location: Left Arm, Patient Position: Sitting, Cuff Size: Normal)   Pulse 75   Temp 97.7 F (36.5 C) (Temporal)   Ht 6\' 2"  (1.88 m)   Wt 195 lb 9.6 oz (88.7 kg)   SpO2 98%   BMI 25.11 kg/m  Wt Readings from Last 3 Encounters:  09/22/20 195 lb 9.6 oz (88.7 kg)  07/20/20 199 lb (90.3 kg)  04/28/20 195 lb (88.5 kg)   Temp Readings from Last 3 Encounters:  09/22/20 97.7 F (36.5 C) (Temporal)  01/13/20 (!) 97.1 F (36.2 C)  11/21/19 99.1 F (37.3 C)   BP Readings from Last 3 Encounters:  09/22/20 112/70  07/20/20 116/78  01/13/20 115/68   Pulse Readings from Last 3 Encounters:  09/22/20 75  07/20/20 85  01/13/20 69     Physical Exam Constitutional:      General: He is not in acute distress.    Appearance: He is well-developed.  HENT:     Head: Normocephalic and atraumatic.     Right Ear: Tympanic membrane and ear canal normal.     Left Ear: Tympanic membrane and ear canal normal.     Nose: Nose normal.     Mouth/Throat:     Mouth: Mucous membranes are moist.     Pharynx: Oropharynx is clear.  Eyes:     Conjunctiva/sclera: Conjunctivae normal.  Neck:     Thyroid: No thyromegaly.  Cardiovascular:     Rate and Rhythm: Normal rate and regular rhythm.     Heart sounds: Normal heart sounds.  Pulmonary:     Effort: Pulmonary effort is normal.      Breath sounds: Normal breath sounds. No wheezing or rhonchi.  Abdominal:     General: Bowel sounds are normal. There is no distension.     Palpations: Abdomen is soft. There is no mass.     Tenderness: There is no abdominal tenderness. There  is no guarding or rebound.  Musculoskeletal:        General: Normal range of motion.     Cervical back: Neck supple.     Right lower leg: No edema.     Left lower leg: No edema.  Lymphadenopathy:     Cervical: No cervical adenopathy.  Skin:    General: Skin is warm and dry.  Neurological:     Mental Status: He is alert and oriented to person, place, and time.     Motor: No abnormal muscle tone.     Coordination: Coordination normal.  Psychiatric:        Behavior: Behavior normal.     A/P:  1. Acquired hypothyroidism - TSH - T4, free Refill: - levothyroxine (EUTHYROX) 75 MCG tablet; TAKE 1 TABLET BY MOUTH ONCE DAILY  Dispense: 90 tablet; Refill: 3  2. Elevated fasting glucose - Comprehensive metabolic panel - Hemoglobin A1c  3. Screening for prostate cancer - PSA  4. Screening for lipid disorders - Lipid panel  5. Pneumococcal vaccination declined by patient  6. COVID-19 vaccine series declined    This visit occurred during the SARS-CoV-2 public health emergency.  Safety protocols were in place, including screening questions prior to the visit, additional usage of staff PPE, and extensive cleaning of exam room while observing appropriate contact time as indicated for disinfecting solutions.

## 2020-11-17 ENCOUNTER — Encounter: Payer: Self-pay | Admitting: Dermatology

## 2020-11-17 ENCOUNTER — Other Ambulatory Visit: Payer: Self-pay

## 2020-11-17 ENCOUNTER — Ambulatory Visit: Payer: Medicare HMO | Admitting: Dermatology

## 2020-11-17 DIAGNOSIS — L570 Actinic keratosis: Secondary | ICD-10-CM

## 2020-11-17 DIAGNOSIS — Z1283 Encounter for screening for malignant neoplasm of skin: Secondary | ICD-10-CM | POA: Diagnosis not present

## 2020-11-17 MED ORDER — TOLAK 4 % EX CREA: TOPICAL_CREAM | CUTANEOUS | 1 refills | Status: AC

## 2020-11-29 ENCOUNTER — Encounter: Payer: Self-pay | Admitting: Dermatology

## 2020-11-29 NOTE — Progress Notes (Signed)
   Follow-Up Visit   Subjective  Nathan [name] is a 70 y.o. male who presents for the following: Annual Exam (No new concerns).  General skin examination, some crusts on face Location:  Duration:  Quality:  Associated Signs/Symptoms: Modifying Factors:  Severity:  Timing: Context:   Objective  Well appearing patient in no apparent distress; mood and affect are within normal limits. General skin examination: No atypical pigmented lesions or nonmelanoma skin cancer.  Left Malar Cheek Multiple small gritty pink crusts    A full examination was performed including scalp, head, eyes, ears, nose, lips, neck, chest, axillae, abdomen, back, buttocks, bilateral upper extremities, bilateral lower extremities, hands, feet, fingers, toes, fingernails, and toenails. All findings within normal limits unless otherwise noted below.   Assessment & Plan    AK (actinic keratosis) Left Malar Cheek  Tolak cream this winter: Goal of 28 total applications but if daily application too irritating he is to contact me.  Fluorouracil (TOLAK) 4 % CREA - Left Malar Cheek Apply to affected area Monday - Friday x 09-32 applications  Encounter for screening for malignant neoplasm of skin  Annual skin examination, self examine with spouse twice annually, continued ultraviolet protection.      I, Lavonna Monarch, MD, have reviewed all documentation for this visit.  The documentation on 11/29/20 for the exam, diagnosis, procedures, and orders are all accurate and complete.

## 2020-12-21 ENCOUNTER — Ambulatory Visit (INDEPENDENT_AMBULATORY_CARE_PROVIDER_SITE_OTHER): Payer: Medicare HMO | Admitting: *Deleted

## 2020-12-21 DIAGNOSIS — Z0000 Encounter for general adult medical examination without abnormal findings: Secondary | ICD-10-CM

## 2020-12-21 NOTE — Progress Notes (Signed)
Subjective:   Nathan [name] is a 70 y.o. male who presents for Medicare Annual/Subsequent preventive examination.  I connected with  [name] on 12/21/20 by a telephone enabled telemedicine application and verified that I am speaking with the correct person using two identifiers.   I discussed the limitations of evaluation and management by telemedicine. The patient expressed understanding and agreed to proceed.    Review of Systems     Cardiac Risk Factors include: advanced age (>57men, >37 women);male gender;obesity (BMI >30kg/m2)     Objective:    Today's Vitals   12/21/20 1116  PainSc: 3    There is no height or weight on file to calculate BMI.  Advanced Directives 12/21/2020 12/16/2019 11/21/2019  Does Patient Have a Medical Advance Directive? No Yes No  Type of Advance Directive - [place];Living will -  Copy of [place] in Chart? - No - copy requested -  Would patient like information on creating a medical advance directive? No - Patient declined - No - Patient declined    Current Medications (verified) Outpatient Encounter Medications as of 12/21/2020  Medication Sig   Cyanocobalamin (VITAMIN B 12 PO) Take by mouth.   Fluorouracil (TOLAK) 4 % CREA Apply to affected area Monday - Friday x 79-15 applications   levothyroxine (EUTHYROX) 75 MCG tablet TAKE 1 TABLET BY MOUTH ONCE DAILY   sucralfate (CARAFATE) 1 g tablet Take 1 tablet (1 g total) by mouth 2 (two) times daily for 28 days.   Facility-Administered Encounter Medications as of 12/21/2020  Medication   0.9 %  sodium chloride infusion    Allergies (verified) Bactrim [sulfamethoxazole-trimethoprim]   History: Past Medical History:  Diagnosis Date   Chronic pain of both knees 03/18/2018   Colon polyps    GERD (gastroesophageal reflux disease)    Hepatitis C    s/p tx with Harvoni   IBS (irritable bowel syndrome)    Skin cancer    Thyroid disease    Past  Surgical History:  Procedure Laterality Date   BUNIONECTOMY Right    COLONOSCOPY  05/02/2016   [facility]   ESOPHAGOGASTRODUODENOSCOPY  06/16/2015   [facility]   LIPOMA EXCISION     TONSILLECTOMY     TRANSURETHRAL RESECTION OF PROSTATE  2018   UPPER GASTROINTESTINAL ENDOSCOPY  01/13/2020   11/21/19, stricture, dilation   Family History  Problem Relation Age of Onset   Heart disease Mother    Heart disease Father    Hearing loss Father    Hypertension Father    Heart disease Sister    Hypertension Sister    Heart disease Brother    Hypertension Brother    Heart disease Maternal Grandfather    Heart disease Paternal Grandfather    Colon cancer Neg Hx    Esophageal cancer Neg Hx    Rectal cancer Neg Hx    Stomach cancer Neg Hx    Colon polyps Neg Hx    Social History   Socioeconomic History   Marital status: Married    Spouse name: Not on file   Number of children: Not on file   Years of education: Not on file   Highest education level: Not on file  Occupational History   Not on file  Tobacco Use   Smoking status: Never   Smokeless tobacco: Never  Vaping Use   Vaping Use: Never used  Substance and Sexual Activity   Alcohol use: Not  Currently   Drug use: Never   Sexual activity: Yes  Other Topics Concern   Not on file  Social History Narrative   Not on file   Social Determinants of Health   Financial Resource Strain: Low Risk    Difficulty of Paying Living Expenses: Not hard at all  Food Insecurity: No Food Insecurity   Worried About Charity fundraiser in the Last Year: Never true   [place] in the Last Year: Never true  Transportation Needs: No Transportation Needs   Lack of Transportation (Medical): No   Lack of Transportation (Non-Medical): No  Physical Activity: Sufficiently Active   Days of Exercise per Week: 4 days   Minutes of Exercise per Session: 40 min  Stress: No Stress Concern Present   Feeling of  Stress : Not at all  Social Connections: Socially Integrated   Frequency of Communication with Friends and Family: More than three times a week   Frequency of Social Gatherings with Friends and Family: Three times a week   Attends Religious Services: More than 4 times per year   Active Member of Clubs or Organizations: Yes   Attends Archivist Meetings: 1 to 4 times per year   Marital Status: Married    Tobacco Counseling Counseling given: Not Answered   Clinical Intake:  Pre-visit preparation completed: Yes  Pain : 0-10 Pain Score: 3  Pain Type: Chronic pain Pain Location: Back Pain Orientation: Posterior Pain Descriptors / Indicators: Aching, Sharp Pain Onset: 1 to 4 weeks ago Pain Frequency: Several days a week     Nutritional Risks: None Diabetes: No  How often do you need to have someone help you when you read instructions, pamphlets, or other written materials from your doctor or pharmacy?: 1 - Never  Diabetic?no  Interpreter Needed?: No  Information entered by :: Leroy Kennedy LPN   Activities of Daily Living In your present state of health, do you have any difficulty performing the following activities: 12/21/2020  Hearing? N  Vision? N  Difficulty concentrating or making decisions? N  Walking or climbing stairs? N  Dressing or bathing? N  Doing errands, shopping? N  Preparing Food and eating ? N  Using the Toilet? N  In the past six months, have you accidently leaked urine? N  Do you have problems with loss of bowel control? N  Managing your Medications? N  Managing your Finances? N  Housekeeping or managing your Housekeeping? N  Some recent data might be hidden    Patient Care Team: Ronnald Nian, DO as PCP - General (Family Medicine) Lavonna Monarch, MD as Consulting Physician (Dermatology)  Indicate any recent Medical Services you may have received from other than [facility] providers in the past year (date may be approximate).      Assessment:   This is a routine wellness examination for Nathan [name].  Hearing/Vision screen Hearing Screening - Comments:: No trouble hearing Vision Screening - Comments:: Up to date  Dr. Aundra Dubin  Dietary issues and exercise activities discussed: Current Exercise Habits: Home exercise routine, Type of exercise: walking, Time (Minutes): 45, Frequency (Times/Week): 4, Weekly Exercise (Minutes/Week): 180, Intensity: Mild   Goals Addressed             This Visit's Progress    Patient Stated   On track    Continue walking daily     Patient Stated       Maintain current health       Depression  Screen PHQ 2/9 Scores 12/21/2020 12/16/2019 10/02/2018  PHQ - 2 Score 0 0 0    Fall Risk Fall Risk  12/21/2020 12/16/2019 10/02/2018 03/26/2018  Falls in the past year? 0 0 0 0  Number falls in past yr: 0 0 - -  Injury with Fall? 0 0 - -  Follow up Falls evaluation completed;Falls prevention discussed Falls prevention discussed Falls evaluation completed -    FALL RISK PREVENTION PERTAINING TO THE HOME:  Any stairs in or around the home? Yes  If so, are there any without handrails? No  Home free of loose throw rugs in walkways, pet beds, electrical cords, etc? Yes  Adequate lighting in your home to reduce risk of falls? Yes   ASSISTIVE DEVICES UTILIZED TO PREVENT FALLS:  Life alert? No  Use of a cane, walker or w/c? No  Grab bars in the bathroom? Yes  Shower chair or bench in shower? No  Elevated toilet seat or a handicapped toilet? Yes   TIMED UP AND GO:  Was the test performed? No .    Cognitive Function:        Immunizations Immunization History  Administered Date(s) Administered   Hep A / Hep B 09/25/2011    TDAP status: Due, Education has been provided regarding the importance of this vaccine. Advised may receive this vaccine at local pharmacy or Health Dept. Aware to provide a copy of the vaccination record if obtained from local pharmacy or Health Dept.  Verbalized acceptance and understanding.  Flu Vaccine status: Declined, Education has been provided regarding the importance of this vaccine but patient still declined. Advised may receive this vaccine at local pharmacy or Health Dept. Aware to provide a copy of the vaccination record if obtained from local pharmacy or Health Dept. Verbalized acceptance and understanding.  Pneumococcal vaccine status: Due, Education has been provided regarding the importance of this vaccine. Advised may receive this vaccine at local pharmacy or Health Dept. Aware to provide a copy of the vaccination record if obtained from local pharmacy or Health Dept. Verbalized acceptance and understanding.  Covid-19 vaccine status: Declined, Education has been provided regarding the importance of this vaccine but patient still declined. Advised may receive this vaccine at local pharmacy or Health Dept.or vaccine clinic. Aware to provide a copy of the vaccination record if obtained from local pharmacy or Health Dept. Verbalized acceptance and understanding.  Qualifies for Shingles Vaccine? Yes   Zostavax completed No   Shingrix Completed?: No.    Education has been provided regarding the importance of this vaccine. Patient has been advised to call insurance company to determine out of pocket expense if they have not yet received this vaccine. Advised may also receive vaccine at local pharmacy or Health Dept. Verbalized acceptance and understanding.  Screening Tests Health Maintenance  Topic Date Due   TETANUS/TDAP  Never done   Zoster Vaccines- Shingrix (1 of 2) Never done   COVID-19 Vaccine (1) 01/06/2021 (Originally 03/14/1951)   INFLUENZA VACCINE  06/03/2021 (Originally 10/04/2020)   COLONOSCOPY (Pts 45-3yrs Insurance coverage will need to be confirmed)  05/02/2026   Hepatitis C Screening  Completed   HPV VACCINES  Aged Out    Health Maintenance  Health Maintenance Due  Topic Date Due   TETANUS/TDAP  Never done    Zoster Vaccines- Shingrix (1 of 2) Never done    Colorectal cancer screening: Type of screening: Colonoscopy. Completed 2018. Repeat every 10 years  Lung Cancer Screening: (Low Dose CT Chest recommended  if Age 3-80 years, 9 pack-year currently smoking OR have quit w/in 15years.) does not qualify.   Lung Cancer Screening Referral:   Additional Screening:  Hepatitis C Screening: does not qualify; Completed 2013  Vision Screening: Recommended annual ophthalmology exams for early detection of glaucoma and other disorders of the eye. Is the patient up to date with their annual eye exam?  Yes  Who is the provider or what is the name of the office in which the patient attends annual eye exams? Dr. Aundra Dubin If pt is not established with a provider, would they like to be referred to a provider to establish care? No .   Dental Screening: Recommended annual dental exams for proper oral hygiene  Community Resource Referral / Chronic Care Management: CRR required this visit?  No   CCM required this visit?  No      Plan:     I have personally reviewed and noted the following in the patient's chart:   Medical and social history Use of alcohol, tobacco or illicit drugs  Current medications and supplements including opioid prescriptions. Patient is not currently taking opioid prescriptions. Functional ability and status Nutritional status Physical activity Advanced directives List of other physicians Hospitalizations, surgeries, and ER visits in previous 12 months Vitals Screenings to include cognitive, depression, and falls Referrals and appointments  In addition, I have reviewed and discussed with patient certain preventive protocols, quality metrics, and best practice recommendations. A written personalized care plan for preventive services as well as general preventive health recommendations were provided to patient.     Leroy Kennedy, LPN   [id number]   Nurse Notes:  patient  will call to schedule ann appointmentTOC

## 2020-12-21 NOTE — Patient Instructions (Signed)
[name] , Thank you for taking time to come for your Medicare Wellness Visit. I appreciate your ongoing commitment to your health goals. Please review the following plan we discussed and let me know if I can assist you in the future.   Screening recommendations/referrals: Colonoscopy: up to date Recommended yearly ophthalmology/optometry visit for glaucoma screening and checkup Recommended yearly dental visit for hygiene and checkup  Vaccinations: Influenza vaccine: Education provided Pneumococcal vaccine: Education provided Tdap vaccine: Education provided Shingles vaccine: Education provided    Advanced directives: Education provided  Conditions/risks identified:    Preventive Care 72 Years and Older, Male Preventive care refers to lifestyle choices and visits with your health care provider that can promote health and wellness. What does preventive care include? A yearly physical exam. This is also called an annual well check. Dental exams once or twice a year. Routine eye exams. Ask your health care provider how often you should have your eyes checked. Personal lifestyle choices, including: Daily care of your teeth and gums. Regular physical activity. Eating a healthy diet. Avoiding tobacco and drug use. Limiting alcohol use. Practicing safe sex. Taking low doses of aspirin every day. Taking vitamin and mineral supplements as recommended by your health care provider. What happens during an annual well check? The services and screenings done by your health care provider during your annual well check will depend on your age, overall health, lifestyle risk factors, and family history of disease. Counseling  Your health care provider may ask you questions about your: Alcohol use. Tobacco use. Drug use. Emotional well-being. Home and relationship well-being. Sexual activity. Eating habits. History of falls. Memory and ability to understand (cognition). Work and work  Statistician. Screening  You may have the following tests or measurements: Height, weight, and BMI. Blood pressure. Lipid and cholesterol levels. These may be checked every 5 years, or more frequently if you are over 50 years old. Skin check. Lung cancer screening. You may have this screening every year starting at age 55 if you have a 30-pack-year history of smoking and currently smoke or have quit within the past 15 years. Fecal occult blood test (FOBT) of the stool. You may have this test every year starting at age 59. Flexible sigmoidoscopy or colonoscopy. You may have a sigmoidoscopy every 5 years or a colonoscopy every 10 years starting at age 59. Prostate cancer screening. Recommendations will vary depending on your family history and other risks. Hepatitis C blood test. Hepatitis B blood test. Sexually transmitted disease (STD) testing. Diabetes screening. This is done by checking your blood sugar (glucose) after you have not eaten for a while (fasting). You may have this done every 1-3 years. Abdominal aortic aneurysm (AAA) screening. You may need this if you are a current or former smoker. Osteoporosis. You may be screened starting at age 34 if you are at high risk. Talk with your health care provider about your test results, treatment options, and if necessary, the need for more tests. Vaccines  Your health care provider may recommend certain vaccines, such as: Influenza vaccine. This is recommended every year. Tetanus, diphtheria, and acellular pertussis (Tdap, Td) vaccine. You may need a Td booster every 10 years. Zoster vaccine. You may need this after age 36. Pneumococcal 13-valent conjugate (PCV13) vaccine. One dose is recommended after age 27. Pneumococcal polysaccharide (PPSV23) vaccine. One dose is recommended after age 32. Talk to your health care provider about which screenings and vaccines you need and how often you need them. This  information is not intended to replace  advice given to you by your health care provider. Make sure you discuss any questions you have with your health care provider. Document Released: 03/19/2015 Document Revised: 11/10/2015 Document Reviewed: 12/22/2014 Elsevier Interactive Patient Education  2017 [place] Prevention in the Home Falls can cause injuries. They can happen to people of all ages. There are many things you can do to make your home safe and to help prevent falls. What can I do on the outside of my home? Regularly fix the edges of walkways and driveways and fix any cracks. Remove anything that might make you trip as you walk through a door, such as a raised step or threshold. Trim any bushes or trees on the path to your home. Use bright outdoor lighting. Clear any walking paths of anything that might make someone trip, such as rocks or tools. Regularly check to see if handrails are loose or broken. Make sure that both sides of any steps have handrails. Any raised decks and porches should have guardrails on the edges. Have any leaves, snow, or ice cleared regularly. Use sand or salt on walking paths during winter. Clean up any spills in your garage right away. This includes oil or grease spills. What can I do in the bathroom? Use night lights. Install grab bars by the toilet and in the tub and shower. Do not use towel bars as grab bars. Use non-skid mats or decals in the tub or shower. If you need to sit down in the shower, use a plastic, non-slip stool. Keep the floor dry. Clean up any water that spills on the floor as soon as it happens. Remove soap buildup in the tub or shower regularly. Attach bath mats securely with double-sided non-slip rug tape. Do not have throw rugs and other things on the floor that can make you trip. What can I do in the bedroom? Use night lights. Make sure that you have a light by your bed that is easy to reach. Do not use any sheets or blankets that are too big for your bed.  They should not hang down onto the floor. Have a firm chair that has side arms. You can use this for support while you get dressed. Do not have throw rugs and other things on the floor that can make you trip. What can I do in the kitchen? Clean up any spills right away. Avoid walking on wet floors. Keep items that you use a lot in easy-to-reach places. If you need to reach something above you, use a strong step stool that has a grab bar. Keep electrical cords out of the way. Do not use floor polish or wax that makes floors slippery. If you must use wax, use non-skid floor wax. Do not have throw rugs and other things on the floor that can make you trip. What can I do with my stairs? Do not leave any items on the stairs. Make sure that there are handrails on both sides of the stairs and use them. Fix handrails that are broken or loose. Make sure that handrails are as long as the stairways. Check any carpeting to make sure that it is firmly attached to the stairs. Fix any carpet that is loose or worn. Avoid having throw rugs at the top or bottom of the stairs. If you do have throw rugs, attach them to the floor with carpet tape. Make sure that you have a light switch at the top  of the stairs and the bottom of the stairs. If you do not have them, ask someone to add them for you. What else can I do to help prevent falls? Wear shoes that: Do not have high heels. Have rubber bottoms. Are comfortable and fit you well. Are closed at the toe. Do not wear sandals. If you use a stepladder: Make sure that it is fully opened. Do not climb a closed stepladder. Make sure that both sides of the stepladder are locked into place. Ask someone to hold it for you, if possible. Clearly mark and make sure that you can see: Any grab bars or handrails. First and last steps. Where the edge of each step is. Use tools that help you move around (mobility aids) if they are needed. These  include: Canes. Walkers. Scooters. Crutches. Turn on the lights when you go into a dark area. Replace any light bulbs as soon as they burn out. Set up your furniture so you have a clear path. Avoid moving your furniture around. If any of your floors are uneven, fix them. If there are any pets around you, be aware of where they are. Review your medicines with your doctor. Some medicines can make you feel dizzy. This can increase your chance of falling. Ask your doctor what other things that you can do to help prevent falls. This information is not intended to replace advice given to you by your health care provider. Make sure you discuss any questions you have with your health care provider. Document Released: 12/17/2008 Document Revised: 07/29/2015 Document Reviewed: 03/27/2014 Elsevier Interactive Patient Education  2017 [employer].

## 2021-03-21 ENCOUNTER — Telehealth: Payer: Self-pay

## 2021-03-21 ENCOUNTER — Ambulatory Visit: Payer: Medicare HMO | Admitting: Dermatology

## 2021-03-21 ENCOUNTER — Encounter: Payer: Self-pay | Admitting: Dermatology

## 2021-03-21 ENCOUNTER — Other Ambulatory Visit: Payer: Self-pay

## 2021-03-21 DIAGNOSIS — Z1283 Encounter for screening for malignant neoplasm of skin: Secondary | ICD-10-CM

## 2021-03-21 DIAGNOSIS — D485 Neoplasm of uncertain behavior of skin: Secondary | ICD-10-CM

## 2021-03-21 DIAGNOSIS — D0462 Carcinoma in situ of skin of left upper limb, including shoulder: Secondary | ICD-10-CM

## 2021-03-21 NOTE — Patient Instructions (Signed)

## 2021-03-21 NOTE — Telephone Encounter (Signed)
Received a manufacture change on patients Levothyroxine 75 mcg that was prescribed by Dr Bryan Lemma on 09/22/20.   Please advise if okay to change? Thanks. Dm/cma

## 2021-03-21 NOTE — Telephone Encounter (Signed)
Pharmacy notified VIA phone and message left for patient to call back to schedule TOC appt before he runs out of medication. Dm/cma

## 2021-04-10 ENCOUNTER — Encounter: Payer: Self-pay | Admitting: Dermatology

## 2021-04-10 NOTE — Progress Notes (Signed)
° °  Follow-Up Visit   Subjective  Nathan [name] is a 71 y.o. male who presents for the following: Skin Problem (Spot on the LEFT ARM x 2-3wks. Pt states that it only itches ).  Skin check, new crust left forearm Location:  Duration:  Quality:  Associated Signs/Symptoms: Modifying Factors:  Severity:  Timing: Context:   Objective  Well appearing patient in no apparent distress; mood and affect are within normal limits. Scalp Upper body exam: No atypical pigmented lesions.  1 possible nonmelanoma skin cancer left forearm will be biopsied and treated  Left Forearm - Anterior 7 mm waxy pink crust, probable superficial carcinoma         All skin waist up examined.   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 Left Forearm - Anterior  Skin / nail biopsy Type of biopsy: tangential   Informed consent: discussed and consent obtained   Timeout: patient name, date of birth, surgical site, and procedure verified   Anesthesia: the lesion was anesthetized in a standard fashion   Anesthetic:  1% lidocaine w/ epinephrine 1-100,000 local infiltration Instrument used: flexible razor blade   Hemostasis achieved with: aluminum chloride and electrodesiccation   Outcome: patient tolerated procedure well   Post-procedure details: wound care instructions given    Destruction of lesion Complexity: simple   Destruction method: electrodesiccation and curettage   Informed consent: discussed and consent obtained   Timeout:  patient name, date of birth, surgical site, and procedure verified Anesthesia: the lesion was anesthetized in a standard fashion   Anesthetic:  1% lidocaine w/ epinephrine 1-100,000 local infiltration Curettage performed in three different directions: Yes   Electrodesiccation performed over the curetted area: Yes   Curettage cycles:  1 Lesion length (cm):  1.1 Lesion  width (cm):  1.1 Margin per side (cm):  0 Final wound size (cm):  1.1 Hemostasis achieved with:  ferric subsulfate and electrodesiccation Outcome: patient tolerated procedure well with no complications   Post-procedure details: sterile dressing applied and wound care instructions given   Dressing type: bandage and petrolatum    Specimen 1 - Surgical pathology Differential Diagnosis: R/O BCC VS SCC - TXPBX  Check Margins: No  After shave biopsy the base of the lesion was treated by curettage and cautery      I, Lavonna Monarch, MD, have reviewed all documentation for this visit.  The documentation on 04/10/21 for the exam, diagnosis, procedures, and orders are all accurate and complete.

## 2021-04-11 ENCOUNTER — Encounter: Payer: Self-pay | Admitting: Dermatology

## 2021-08-29 DIAGNOSIS — E039 Hypothyroidism, unspecified: Secondary | ICD-10-CM | POA: Diagnosis not present

## 2021-08-29 DIAGNOSIS — Z0000 Encounter for general adult medical examination without abnormal findings: Secondary | ICD-10-CM | POA: Diagnosis not present

## 2021-08-29 DIAGNOSIS — E038 Other specified hypothyroidism: Secondary | ICD-10-CM | POA: Diagnosis not present

## 2021-11-17 DIAGNOSIS — H2513 Age-related nuclear cataract, bilateral: Secondary | ICD-10-CM | POA: Diagnosis not present

## 2021-11-17 DIAGNOSIS — L570 Actinic keratosis: Secondary | ICD-10-CM | POA: Diagnosis not present

## 2021-11-17 DIAGNOSIS — Z86007 Personal history of in-situ neoplasm of skin: Secondary | ICD-10-CM | POA: Diagnosis not present

## 2021-11-17 DIAGNOSIS — L814 Other melanin hyperpigmentation: Secondary | ICD-10-CM | POA: Diagnosis not present

## 2021-11-17 DIAGNOSIS — Z08 Encounter for follow-up examination after completed treatment for malignant neoplasm: Secondary | ICD-10-CM | POA: Diagnosis not present

## 2021-11-17 DIAGNOSIS — D225 Melanocytic nevi of trunk: Secondary | ICD-10-CM | POA: Diagnosis not present

## 2021-11-17 DIAGNOSIS — H5203 Hypermetropia, bilateral: Secondary | ICD-10-CM | POA: Diagnosis not present

## 2021-11-21 ENCOUNTER — Ambulatory Visit: Payer: Medicare HMO | Admitting: Dermatology

## 2022-01-31 DIAGNOSIS — H25043 Posterior subcapsular polar age-related cataract, bilateral: Secondary | ICD-10-CM | POA: Diagnosis not present

## 2022-01-31 DIAGNOSIS — H2513 Age-related nuclear cataract, bilateral: Secondary | ICD-10-CM | POA: Diagnosis not present

## 2022-01-31 DIAGNOSIS — H18413 Arcus senilis, bilateral: Secondary | ICD-10-CM | POA: Diagnosis not present

## 2022-01-31 DIAGNOSIS — H25013 Cortical age-related cataract, bilateral: Secondary | ICD-10-CM | POA: Diagnosis not present

## 2022-01-31 DIAGNOSIS — H2511 Age-related nuclear cataract, right eye: Secondary | ICD-10-CM | POA: Diagnosis not present

## 2022-08-02 ENCOUNTER — Telehealth: Payer: Self-pay | Admitting: Gastroenterology

## 2022-08-02 NOTE — Telephone Encounter (Signed)
Patient called stating he has been having indigestion and discomfort in the chest for the past few weeks. Advised of the next available appointment. States he can not wait that long because his symptoms are getting worse. He requesting a call back seeking further suggestions. Please advise, thank you.

## 2022-08-02 NOTE — Telephone Encounter (Signed)
Called and spoke with patient. Patient states that his indigestion has returned within the last few weeks, but was worse overnight. Patient states that he had steady chest discomfort all night in his sternum. Patient states that he has not eaten anything that could have triggered this. Pt has tried baking soda and water, that gave him minimal relief. Patient is not sure what to do. He states that he woke up at 1 am with heartburn and discomfort. No nausea, vomiting, or regurgitation. He states that he basically has to sit straight up to get some relief. Pt reports that the sucralfate that he tried last time "didn't do much". Patient is wondering what can be done at this time. Next available appt is not until August. Please advise, thanks

## 2022-08-02 NOTE — Telephone Encounter (Signed)
Called and spoke with patient regarding recommendations. Pt will reach out to PCP for evaluation to rule out cardiac or pulmonary etiology. Patient is scheduled for a f/u with Dr. Barron Alvine on Friday, 10/27/22 at 9:40 am. Pt has been advised of symptoms that will require expedited ED evaluation. Patient verbalized understanding and had no concerns at the end of the call.

## 2022-10-27 ENCOUNTER — Ambulatory Visit: Payer: Medicare HMO | Admitting: Gastroenterology

## 2023-05-03 ENCOUNTER — Ambulatory Visit (INDEPENDENT_AMBULATORY_CARE_PROVIDER_SITE_OTHER): Payer: Medicare HMO | Admitting: Sports Medicine

## 2023-05-03 ENCOUNTER — Encounter: Payer: Self-pay | Admitting: Sports Medicine

## 2023-05-03 VITALS — BP 130/78 | Ht 74.0 in | Wt 190.0 lb

## 2023-05-03 DIAGNOSIS — M25512 Pain in left shoulder: Secondary | ICD-10-CM

## 2023-05-03 NOTE — Progress Notes (Signed)
   Subjective:    Patient ID: Nathan [name], male    DOB: January 12, 1951, 73 y.o.   MRN: [id number]  HPI chief complaint: Left shoulder pain  Patient is a very pleasant right-hand-dominant 73 year old male that presents today with acute on chronic left shoulder pain.  Patient tells me he has had intermittent left shoulder pain the majority of his life.  Pain will typically resolve on its own.  In fact, he has never needed to seek medical attention for his shoulder pain until now.  Yesterday, while getting dressed, he felt an acute pain in the left shoulder which was worse than what he has experienced previously.  He had difficulty sleeping last night.  He localizes his pain to the lateral shoulder area.  He does have pain with certain movements.  He did not notice any bruising or swelling.  He denies any recent or remote trauma to the shoulder.  No prior shoulder surgeries.  Past medical history reviewed Medications reviewed Allergies reviewed  Review of Systems As above    Objective:   Physical Exam  Well-developed, well-nourished.  No acute distress  Left shoulder: Patient does have limited active abduction but has nearly full forward flexion, external rotation, and internal rotation.  No ecchymosis.  No gross deformity.  Rotator cuff strength is 4/5 with resisted supraspinatus 5/5 with resisted internal and external rotation.  No tenderness to palpation.  Neurovascularly intact distally.  Limited MSK ultrasound with attention to the left shoulder shows good visualization of both the supraspinatus and infraspinatus tendons.  I do not appreciate any high-grade tearing or retraction of either tendon.     Assessment & Plan:   Acute on chronic left shoulder pain likely secondary to rotator cuff tendinopathy versus small rotator cuff tear  Ultrasound is reassuring.  We discussed treatment options including a watchful waiting approach, use of over-the-counter NSAIDs, and cortisone injection.  We have  elected to take a watchful waiting approach for now and the patient will use over-the-counter Advil or Motrin if needed for pain.  We will schedule a follow-up appointment for next week.  In the meantime, I have encouraged him to continue with shoulder mobility but to avoid any heavy lifting with the left arm.  If his pain persists at follow-up next week then we will reconsider merits of subacromial cortisone injection.  This note was dictated using Dragon naturally speaking software and may contain errors in syntax, spelling, or content which have not been identified prior to signing this note.

## 2023-05-08 ENCOUNTER — Ambulatory Visit: Payer: Medicare HMO | Admitting: Sports Medicine

## 2023-05-08 ENCOUNTER — Encounter: Payer: Self-pay | Admitting: Sports Medicine

## 2023-05-08 VITALS — BP 104/80 | Ht 74.0 in | Wt 190.0 lb

## 2023-05-08 DIAGNOSIS — M25512 Pain in left shoulder: Secondary | ICD-10-CM | POA: Diagnosis not present

## 2023-05-08 NOTE — Progress Notes (Signed)
   Subjective:    Patient ID: Nathan [name], male    DOB: June 05, 1950, 73 y.o.   MRN: [id number]  HPI Nathan [name] presents today for follow-up on left shoulder pain.  He reports that he is 80 to 90% improved.  Minimal pain at this time.   Review of Systems As above    Objective:   Physical Exam  Left shoulder: Full painless range of motion.  No tenderness to palpation.  Rotator cuff strength is 5/5 and does not reproduce any pain.      Assessment & Plan:   Resolving left shoulder pain likely secondary to rotator cuff tendinopathy  Given Nathan [name]'s improvement we will hold on any further workup or treatment at this time.  I did provide him with some Jobe home exercises and we discussed the possibility of formal physical therapy at a later date.  He may start to resume activity as tolerated and will follow-up as needed.  This note was dictated using Dragon naturally speaking software and may contain errors in syntax, spelling, or content which have not been identified prior to signing this note.
